# Patient Record
Sex: Male | Born: 1990 | Race: White | Hispanic: No | Marital: Single | Smoking: Current every day smoker
Health system: Southern US, Community
[De-identification: ages and names within clinical notes are randomized; demographics above are authoritative.]

## PROBLEM LIST (undated history)

## (undated) DIAGNOSIS — Z720 Tobacco use: Secondary | ICD-10-CM

---

## 2011-04-06 ENCOUNTER — Emergency Department: Payer: Self-pay | Admitting: Emergency Medicine

## 2012-01-30 IMAGING — CR DG HAND COMPLETE 3+V*L*
1 series · 3 of 3 positions shown · non-contrast
Comparison: none

REASON FOR EXAM: dog bite, pain, bleeding      ED Wait Area.
COMMENTS:

PROCEDURE:     DXR - DXR HAND LT COMPLETE  W/OBLIQUES  - April 06, 2011 [DATE]
RESULT:     There is no evidence of fracture, dislocation, or malalignment.

[Series 1: view not recorded · 0.17mm/px · 3 of 3 slices shown]
[im 1/3]
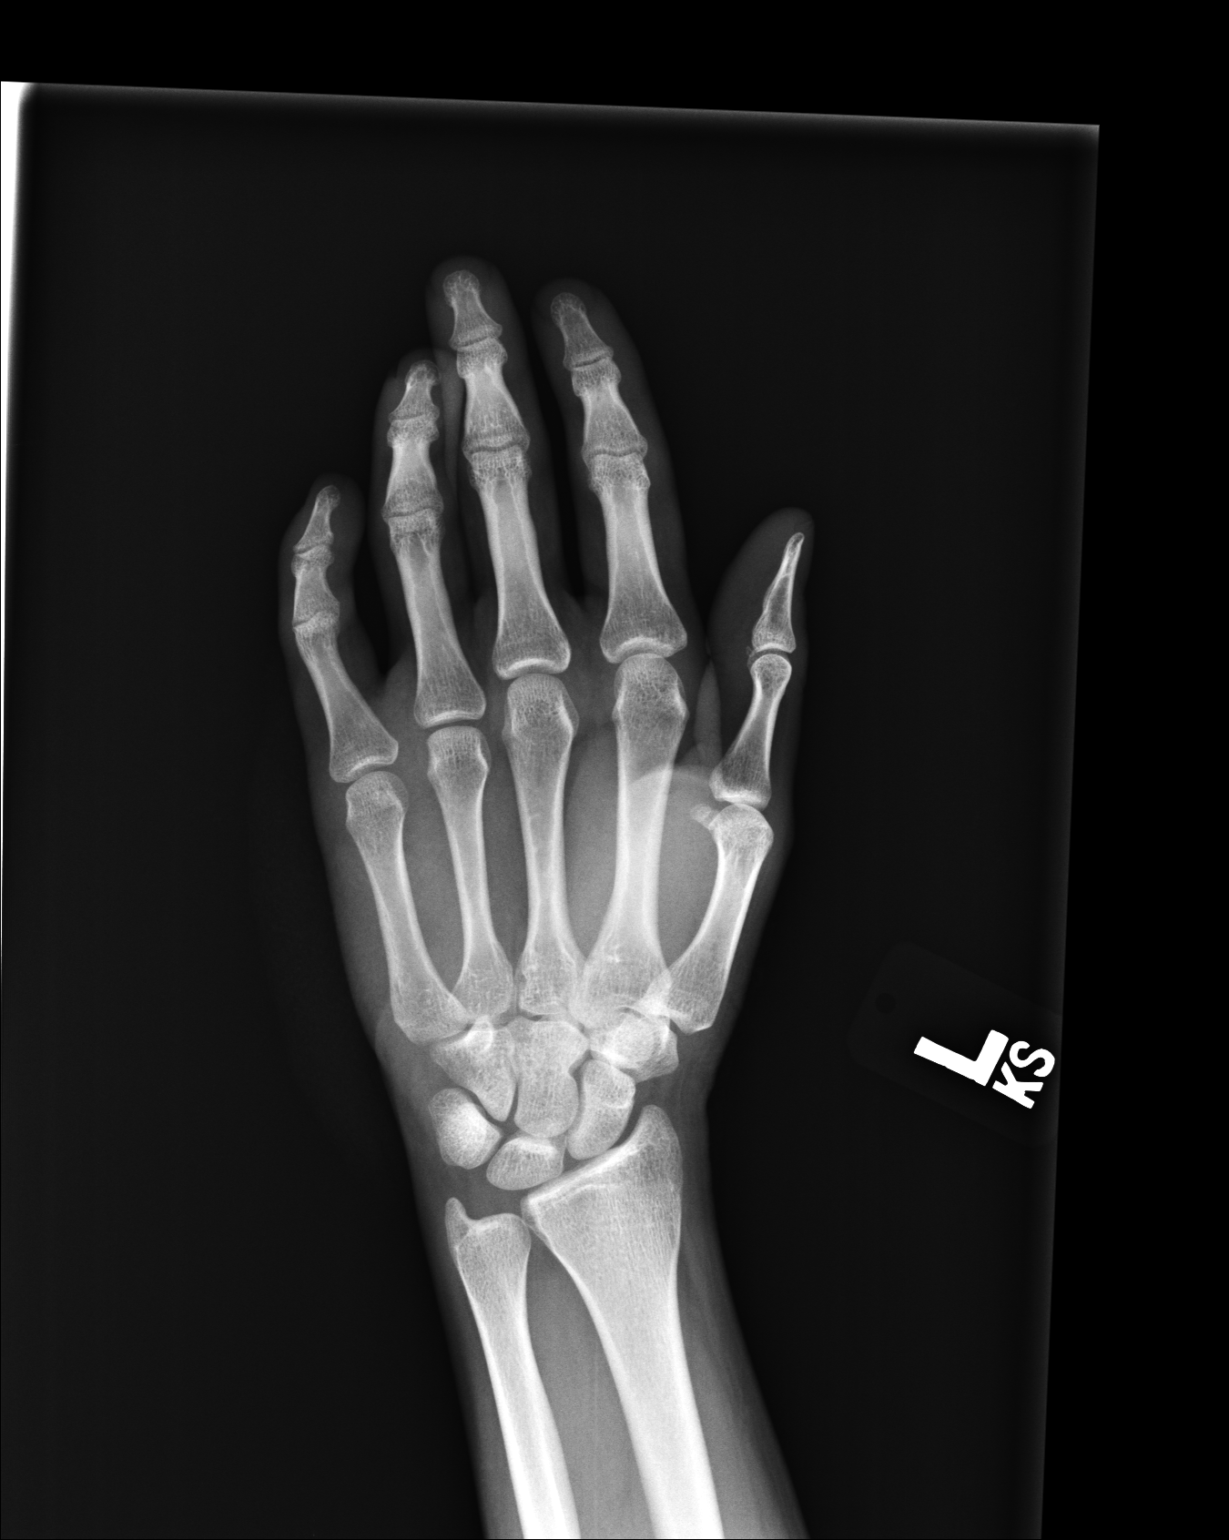
[im 2/3]
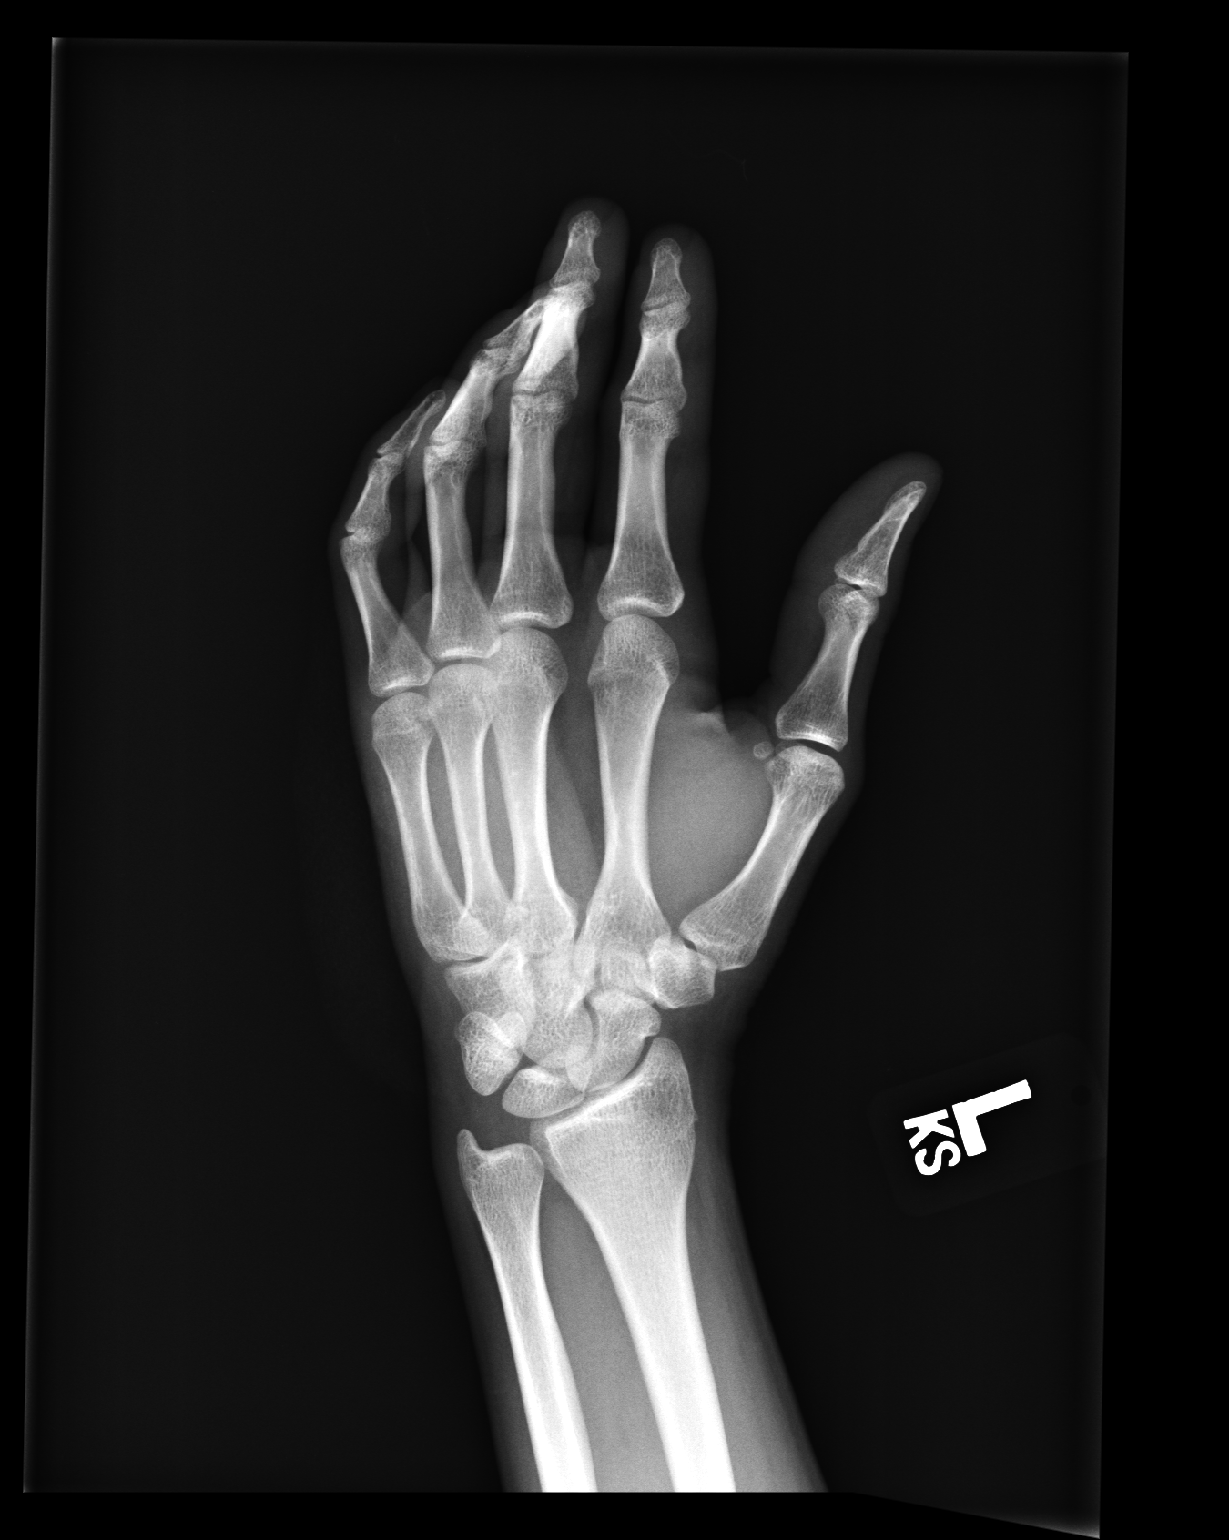
[im 3/3]
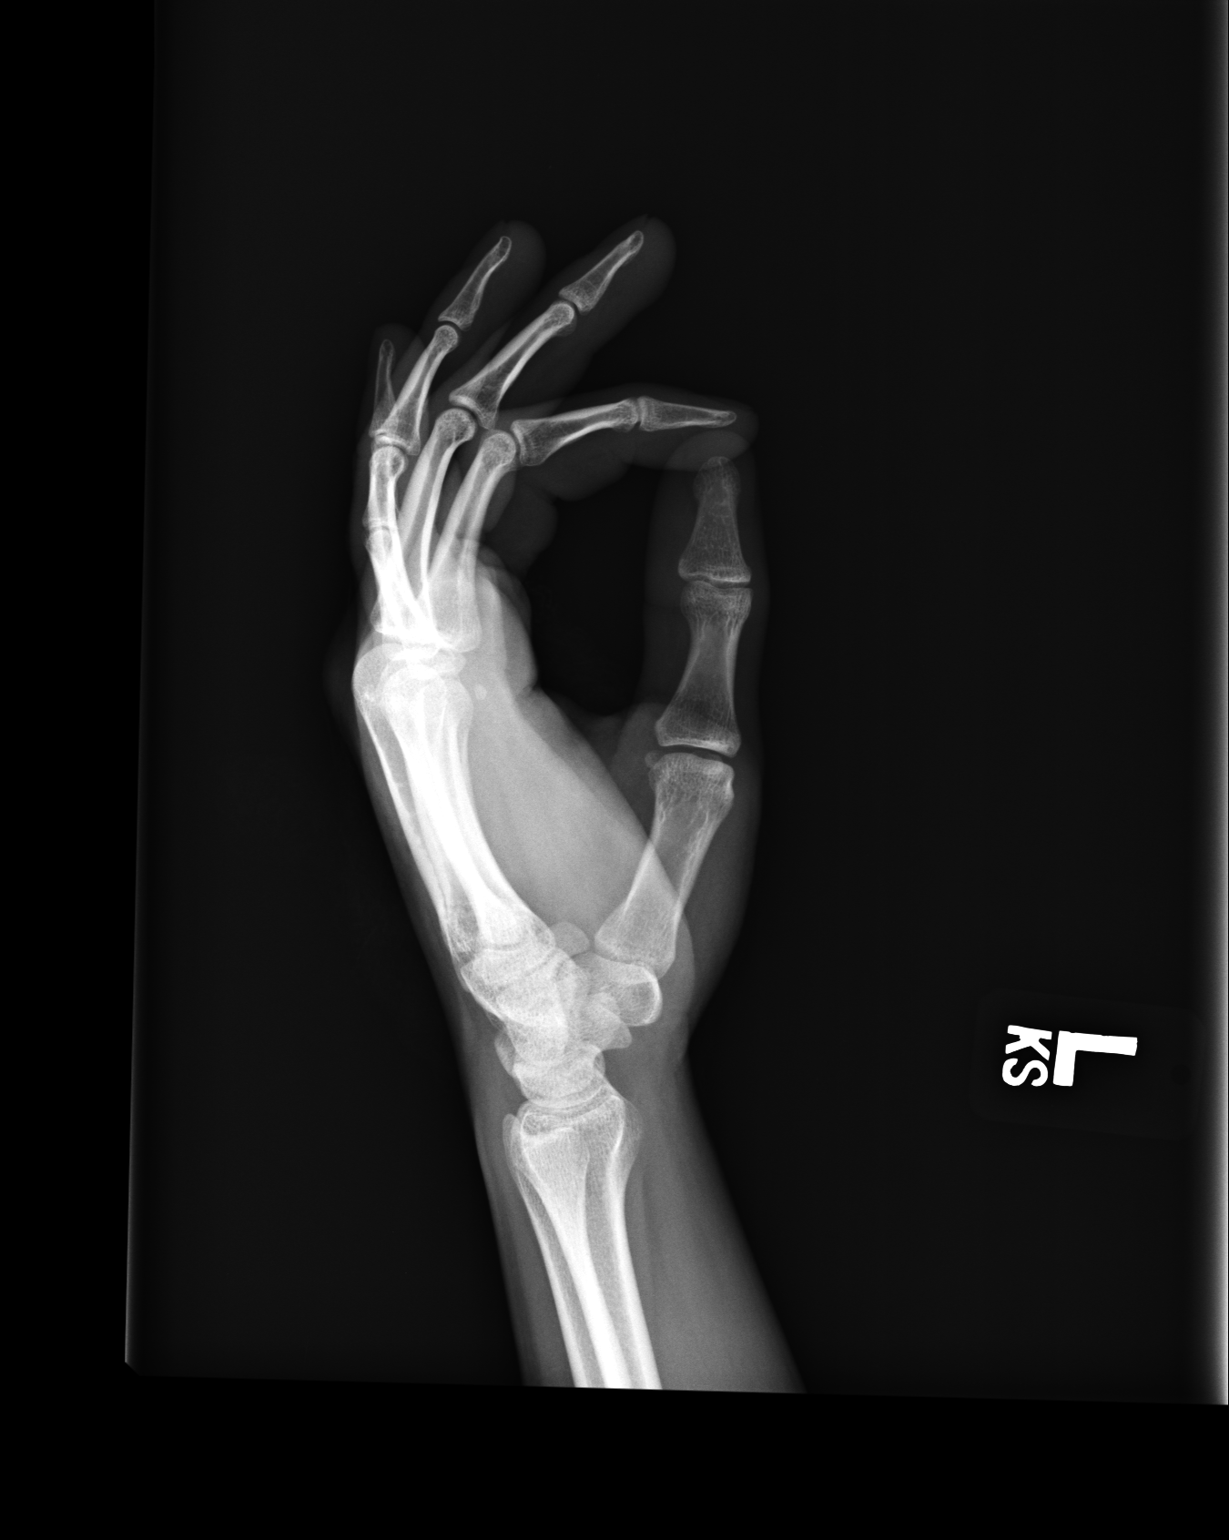

[3 of 3 positions shown; findings below may reference images not displayed]

IMPRESSION: 1. No evidence of acute abnormalities.
2. If there are persistent complaints of pain or persistent clinical
concern, a repeat evaluation in 7-10 days is recommended if clinically
warranted.

## 2019-05-20 ENCOUNTER — Telehealth: Payer: Self-pay

## 2019-05-20 DIAGNOSIS — Z20822 Contact with and (suspected) exposure to covid-19: Secondary | ICD-10-CM

## 2019-05-20 NOTE — Telephone Encounter (Signed)
Contacted by Holley Raring at [facility]. Pt needs COVID-19 testing.  Office [phone] Fax [phone]  Call with mother. Appointment scheduled and order placed.

## 2019-05-23 ENCOUNTER — Other Ambulatory Visit: Payer: Self-pay

## 2019-05-23 DIAGNOSIS — Z20822 Contact with and (suspected) exposure to covid-19: Secondary | ICD-10-CM

## 2019-05-26 LAB — NOVEL CORONAVIRUS, NAA: SARS-CoV-2, NAA: NOT DETECTED

## 2021-11-21 ENCOUNTER — Other Ambulatory Visit: Payer: Self-pay

## 2021-11-21 ENCOUNTER — Ambulatory Visit
Admission: EM | Admit: 2021-11-21 | Discharge: 2021-11-21 | Disposition: A | Discharge status: Home / Self Care | Payer: Self-pay | Attending: Emergency Medicine | Admitting: Emergency Medicine

## 2021-11-21 DIAGNOSIS — J069 Acute upper respiratory infection, unspecified: Secondary | ICD-10-CM | POA: Insufficient documentation

## 2021-11-21 LAB — GROUP A STREP BY PCR: Group A Strep by PCR: NOT DETECTED

## 2021-11-21 NOTE — ED Triage Notes (Signed)
Pt c/o body chills nasal congestion starting on 11/17/21. Pt has been taking OTC medication. Pt reports a fever of 102. Pt has taken a home covid test and was negative on 11/17/21 and 11/20/21.

## 2021-11-21 NOTE — Discharge Instructions (Addendum)
Your symptoms today are most likely being caused by a virus and should steadily improve in time it can take up to 7 to 10 days before you truly start to see a turnaround however things will get better    Your strep test is pending, we will call if positive and medication sent to pharmacy   You can take Tylenol and/or Ibuprofen as needed for fever reduction and pain relief.   For cough: honey 1/2 to 1 teaspoon (you can dilute the honey in water or another fluid).  You can also use guaifenesin and dextromethorphan for cough. You can use a humidifier for chest congestion and cough.  If you don't have a humidifier, you can sit in the bathroom with the hot shower running.      For sore throat: try warm salt water gargles, cepacol lozenges, throat spray, warm tea or water with lemon/honey, popsicles or ice, or OTC cold relief medicine for throat discomfort.   For congestion: take a daily anti-histamine like Zyrtec, Claritin, and a oral decongestant, such as pseudoephedrine.  You can also use Flonase 1-2 sprays in each nostril daily.   It is important to stay hydrated: drink plenty of fluids (water, gatorade/powerade/pedialyte, juices, or teas) to keep your throat moisturized and help further relieve irritation/discomfort.

## 2021-11-21 NOTE — ED Provider Notes (Signed)
[facility]    CSN: [id number] Arrival date & time: 11/21/21  1244      History   Chief Complaint Chief Complaint  Patient presents with   Cough   Nasal Congestion    HPI Leon Williams is a 30 y.o. male.   Patient presents with chills, fever, nasal congestion, rhinorrhea and nonproductive cough for 5 days.  Decreased appetite but able to tolerate fluids.  Known sick contacts. Has attempted use nyquil or dayquil which was not helpful. Home covid text negative times 2.  No pertinent medical history.  History reviewed. No pertinent past medical history.  There are no problems to display for this patient.   History reviewed. No pertinent surgical history.     Home Medications    Prior to Admission medications   Not on File    Family History History reviewed. No pertinent family history.  Social History Social History   Tobacco Use   Smoking status: Every Day    Types: Cigarettes   Smokeless tobacco: Never  Vaping Use   Vaping Use: Every day  Substance Use Topics   Alcohol use: Never   Drug use: Never     Allergies   Patient has no allergy information on record.   Review of Systems Review of Systems  Constitutional:  Positive for appetite change, chills and fever. Negative for activity change, diaphoresis, fatigue and unexpected weight change.  HENT:  Positive for congestion and rhinorrhea. Negative for dental problem, drooling, ear discharge, ear pain, facial swelling, hearing loss, mouth sores, nosebleeds, postnasal drip, sinus pressure, sinus pain, sneezing, sore throat, tinnitus, trouble swallowing and voice change.   Respiratory:  Positive for cough. Negative for apnea, choking, chest tightness, shortness of breath, wheezing and stridor.   Gastrointestinal: Negative.   Neurological: Negative.     Physical Exam Triage Vital Signs ED Triage Vitals  Enc Vitals Group     BP 11/21/21 1317 137/82     Pulse Rate 11/21/21 1317 (!) 112      Resp 11/21/21 1317 18     Temp 11/21/21 1317 99 F (37.2 C)     Temp Source 11/21/21 1317 Oral     SpO2 11/21/21 1317 100 %     Weight 11/21/21 1315 186 lb (84.4 kg)     Height 11/21/21 1315 5\' 10"  (1.778 m)     Head Circumference --      Peak Flow --      Pain Score 11/21/21 1315 0     Pain Loc --      Pain Edu? --      Excl. in GC? --    No data found.  Updated Vital Signs BP 137/82 (BP Location: Left Arm)    Pulse (!) 112    Temp 99 F (37.2 C) (Oral)    Resp 18    Ht 5\' 10"  (1.778 m)    Wt 186 lb (84.4 kg)    SpO2 100%    BMI 26.69 kg/m   Visual Acuity Right Eye Distance:   Left Eye Distance:   Bilateral Distance:    Right Eye Near:   Left Eye Near:    Bilateral Near:     Physical Exam Constitutional:      Appearance: Normal appearance. He is ill-appearing.  HENT:     Head: Normocephalic.     Right Ear: Tympanic membrane, ear canal and external ear normal.     Left Ear: Tympanic membrane, ear canal and external  ear normal.     Nose: Congestion and rhinorrhea present.     Mouth/Throat:     Mouth: Mucous membranes are moist.     Pharynx: Posterior oropharyngeal erythema present.     Tonsils: No tonsillar exudate. 1+ on the right. 1+ on the left.  Eyes:     Extraocular Movements: Extraocular movements intact.  Cardiovascular:     Rate and Rhythm: Normal rate and regular rhythm.     Pulses: Normal pulses.     Heart sounds: Normal heart sounds.  Pulmonary:     Effort: Pulmonary effort is normal.     Breath sounds: Normal breath sounds.  Musculoskeletal:     Cervical back: Normal range of motion.  Skin:    General: Skin is warm and dry.  Neurological:     Mental Status: He is alert and oriented to person, place, and time. Mental status is at baseline.  Psychiatric:        Mood and Affect: Mood normal.        Behavior: Behavior normal.     UC Treatments / Results  Labs (all labs ordered are listed, but only abnormal results are displayed) Labs Reviewed  - No data to display  EKG   Radiology No results found.  Procedures Procedures (including critical care time)  Medications Ordered in UC Medications - No data to display  Initial Impression / Assessment and Plan / UC Course  I have reviewed the triage vital signs and the nursing notes.  Pertinent labs & imaging results that were available during my care of the patient were reviewed by me and considered in my medical decision making (see chart for details).  Viral URI with cough  Will defer flu testing at this time antibiogram, strep test pending, over-the-counter medications for symptomatic management, encouraged increase fluid intake until appetite returns, urgent care follow-up as needed, work note given Final Clinical Impressions(s) / UC Diagnoses   Final diagnoses:  None   Discharge Instructions   None    ED Prescriptions   None    PDMP not reviewed this encounter.   Valinda Hoar, NP 11/21/21 1419

## 2022-01-21 ENCOUNTER — Other Ambulatory Visit: Payer: Self-pay

## 2022-01-21 ENCOUNTER — Ambulatory Visit
Admission: EM | Admit: 2022-01-21 | Discharge: 2022-01-21 | Disposition: A | Discharge status: Home / Self Care | Payer: Self-pay | Attending: Emergency Medicine | Admitting: Emergency Medicine

## 2022-01-21 DIAGNOSIS — F17200 Nicotine dependence, unspecified, uncomplicated: Secondary | ICD-10-CM

## 2022-01-21 DIAGNOSIS — L03313 Cellulitis of chest wall: Secondary | ICD-10-CM

## 2022-01-21 MED ORDER — DOXYCYCLINE HYCLATE 100 MG PO CAPS: 100.0000 mg | ORAL_CAPSULE | Freq: Two times a day (BID) | ORAL | 0 refills | Status: DC

## 2022-01-21 MED ORDER — MUPIROCIN CALCIUM 2 % EX CREA: 1.0000 "application " | TOPICAL_CREAM | Freq: Two times a day (BID) | CUTANEOUS | 0 refills | Status: DC

## 2022-01-21 NOTE — ED Triage Notes (Addendum)
Patient presents to Urgent Care with complaints of knot on chest area since Saturday. Pt states he is unsure if the knot is result from a chemical that splashed on his chest area. Denies pain but states some burning sensation.   Denies fever.

## 2022-01-21 NOTE — Discharge Instructions (Addendum)
Stop smoking. Rest, push fluids, take meds as directed. Follow up with PCP. Return if worsening or no improvement. Warm compresses.

## 2022-01-21 NOTE — ED Provider Notes (Signed)
[facility]    CSN: [id number] Arrival date & time: 01/21/22  1511      History   Chief Complaint Chief Complaint  Patient presents with   Cough   Sore Throat    HPI Leon Williams is a 31 y.o. male.   31 year old male pt, Leon Williams, presents to urgent care, chief complaint of knot on left side of anterior chest since Saturday, not sure if ingrown hair or chemical from work. NKDA.Smokes ~1ppd of cigarettes.   The history is provided by the patient. No language interpreter was used.   History reviewed. No pertinent past medical history.  Patient Active Problem List   Diagnosis Date Noted   Smoker 01/21/2022    History reviewed. No pertinent surgical history.     Home Medications    Prior to Admission medications   Medication Sig Start Date End Date Taking? Authorizing Provider  doxycycline (VIBRAMYCIN) 100 MG capsule Take 1 capsule (100 mg total) by mouth 2 (two) times daily. 01/21/22  Yes Duvid Smalls, Para March, NP  mupirocin cream (BACTROBAN) 2 % Apply 1 application topically 2 (two) times daily. 01/21/22  Yes Benjerman Molinelli, Para March, NP    Family History History reviewed. No pertinent family history.  Social History Social History   Tobacco Use   Smoking status: Every Day    Types: Cigarettes   Smokeless tobacco: Never  Vaping Use   Vaping Use: Every day  Substance Use Topics   Alcohol use: Never   Drug use: Never     Allergies   Patient has no known allergies.   Review of Systems Review of Systems  Constitutional:  Negative for fever.  Skin:  Positive for color change and wound.  All other systems reviewed and are negative.   Physical Exam Triage Vital Signs ED Triage Vitals  Enc Vitals Group     BP 01/21/22 1602 (!) 145/71     Pulse Rate 01/21/22 1602 (!) 102     Resp 01/21/22 1602 16     Temp 01/21/22 1602 98.2 F (36.8 C)     Temp Source 01/21/22 1602 Oral     SpO2 01/21/22 1602 100 %     Weight --      Height --       Head Circumference --      Peak Flow --      Pain Score 01/21/22 1559 0     Pain Loc --      Pain Edu? --      Excl. in GC? --    No data found.  Updated Vital Signs BP (!) 145/71 (BP Location: Left Arm)    Pulse (!) 102    Temp 98.2 F (36.8 C) (Oral)    Resp 16    SpO2 100%   Visual Acuity Right Eye Distance:   Left Eye Distance:   Bilateral Distance:    Right Eye Near:   Left Eye Near:    Bilateral Near:     Physical Exam Vitals and nursing note reviewed.  Constitutional:      Appearance: Normal appearance. He is well-developed and well-groomed.  HENT:     Head: Normocephalic.  Cardiovascular:     Rate and Rhythm: Normal rate.     Pulses: Normal pulses.     Heart sounds: Normal heart sounds.  Pulmonary:     Effort: Pulmonary effort is normal.     Breath sounds: Normal breath sounds.  Skin:    General: Skin  is warm.     Capillary Refill: Capillary refill takes less than 2 seconds.     Findings: Lesion and wound present.       Neurological:     General: No focal deficit present.     Mental Status: He is alert and oriented to person, place, and time.     Cranial Nerves: Cranial nerves 2-12 are intact.     Sensory: Sensation is intact.     Motor: Motor function is intact.     Coordination: Coordination is intact.     Gait: Gait is intact.  Psychiatric:        Attention and Perception: Attention normal.        Mood and Affect: Mood normal.        Speech: Speech normal.        Behavior: Behavior normal. Behavior is cooperative.     UC Treatments / Results  Labs (all labs ordered are listed, but only abnormal results are displayed) Labs Reviewed - No data to display  EKG   Radiology No results found.  Procedures Procedures (including critical care time)  Medications Ordered in UC Medications - No data to display  Initial Impression / Assessment and Plan / UC Course  I have reviewed the triage vital signs and the nursing notes.  Pertinent labs &  imaging results that were available during my care of the patient were reviewed by me and considered in my medical decision making (see chart for details).     Ddx: Abscess, cellulitis Final Clinical Impressions(s) / UC Diagnoses   Final diagnoses:  Smoker  Cellulitis of chest wall     Discharge Instructions      Stop smoking. Rest, push fluids, take meds as directed. Follow up with PCP. Return if worsening or no improvement. Warm compresses.      ED Prescriptions     Medication Sig Dispense Auth. Provider   doxycycline (VIBRAMYCIN) 100 MG capsule Take 1 capsule (100 mg total) by mouth 2 (two) times daily. 20 capsule Rinoa Garramone, NP   mupirocin cream (BACTROBAN) 2 % Apply 1 application topically 2 (two) times daily. 15 g Robin Pafford, Para March, NP      PDMP not reviewed this encounter.   Clancy Gourd, NP 01/21/22 1705

## 2023-11-06 ENCOUNTER — Other Ambulatory Visit: Payer: Self-pay

## 2023-11-06 ENCOUNTER — Inpatient Hospital Stay
Admission: EM | Admit: 2023-11-06 | Discharge: 2023-11-09 | DRG: 872 | Disposition: A | Discharge status: Home / Self Care | Payer: Self-pay | Attending: Internal Medicine | Admitting: Internal Medicine

## 2023-11-06 ENCOUNTER — Emergency Department: Payer: Self-pay

## 2023-11-06 DIAGNOSIS — J029 Acute pharyngitis, unspecified: Secondary | ICD-10-CM | POA: Diagnosis present

## 2023-11-06 DIAGNOSIS — Z8249 Family history of ischemic heart disease and other diseases of the circulatory system: Secondary | ICD-10-CM | POA: Diagnosis not present

## 2023-11-06 DIAGNOSIS — K1120 Sialoadenitis, unspecified: Secondary | ICD-10-CM | POA: Diagnosis present

## 2023-11-06 DIAGNOSIS — A419 Sepsis, unspecified organism: Principal | ICD-10-CM

## 2023-11-06 DIAGNOSIS — F17210 Nicotine dependence, cigarettes, uncomplicated: Secondary | ICD-10-CM | POA: Diagnosis present

## 2023-11-06 DIAGNOSIS — R000 Tachycardia, unspecified: Secondary | ICD-10-CM | POA: Insufficient documentation

## 2023-11-06 DIAGNOSIS — K0889 Other specified disorders of teeth and supporting structures: Secondary | ICD-10-CM | POA: Diagnosis present

## 2023-11-06 DIAGNOSIS — F17200 Nicotine dependence, unspecified, uncomplicated: Secondary | ICD-10-CM | POA: Diagnosis present

## 2023-11-06 DIAGNOSIS — D72829 Elevated white blood cell count, unspecified: Secondary | ICD-10-CM | POA: Insufficient documentation

## 2023-11-06 HISTORY — DX: Tobacco use: Z72.0

## 2023-11-06 LAB — BASIC METABOLIC PANEL
Anion gap: 8 (ref 5–15)
BUN: 10 mg/dL (ref 6–20)
CO2: 25 mmol/L (ref 22–32)
Calcium: 8.3 mg/dL — ABNORMAL LOW (ref 8.9–10.3)
Chloride: 99 mmol/L (ref 98–111)
Creatinine, Ser: 0.78 mg/dL (ref 0.61–1.24)
GFR, Estimated: >60 mL/min (ref >60)
Glucose, Bld: 109 mg/dL — ABNORMAL HIGH (ref 70–99)
Potassium: 3.8 mmol/L (ref 3.5–5.1)
Sodium: 132 mmol/L — ABNORMAL LOW (ref 135–145)

## 2023-11-06 LAB — GROUP A STREP BY PCR: Group A Strep by PCR: NOT DETECTED

## 2023-11-06 LAB — CBC
HCT: 49.4 % (ref 39.0–52.0)
Hemoglobin: 16.6 g/dL (ref 13.0–17.0)
MCH: 30.6 pg (ref 26.0–34.0)
MCHC: 33.6 g/dL (ref 30.0–36.0)
MCV: 91.1 fL (ref 80.0–100.0)
Platelets: 319 10*3/uL (ref 150–400)
RBC: 5.42 MIL/uL (ref 4.22–5.81)
RDW: 13.9 % (ref 11.5–15.5)
WBC: 19.6 10*3/uL — ABNORMAL HIGH (ref 4.0–10.5)
nRBC: 0 % (ref 0.0–0.2)

## 2023-11-06 LAB — LACTIC ACID, PLASMA
Lactic Acid, Venous: 0.8 mmol/L (ref 0.5–1.9)
Lactic Acid, Venous: 0.7 mmol/L (ref 0.5–1.9)

## 2023-11-06 LAB — MRSA NEXT GEN BY PCR, NASAL: MRSA by PCR Next Gen: NOT DETECTED

## 2023-11-06 MED ORDER — ONDANSETRON HCL 4 MG/2ML IJ SOLN: 4.0000 mg | Freq: Four times a day (QID) | INTRAMUSCULAR | Status: DC | PRN

## 2023-11-06 MED ORDER — ENOXAPARIN SODIUM 40 MG/0.4ML IJ SOSY: 40.0000 mg | PREFILLED_SYRINGE | INTRAMUSCULAR | Status: DC

## 2023-11-06 MED ORDER — PIPERACILLIN-TAZOBACTAM 3.375 G IVPB
3.3750 g | Freq: Three times a day (TID) | INTRAVENOUS | Status: DC
Start: 2023-11-06 — End: 2023-11-07
  Administered 2023-11-06 – 2023-11-07 (×2): 3.375 g via INTRAVENOUS
  Filled 2023-11-06 (×2): qty 50

## 2023-11-06 MED ORDER — ACETAMINOPHEN 650 MG RE SUPP: 650.0000 mg | Freq: Four times a day (QID) | RECTAL | Status: DC | PRN

## 2023-11-06 MED ORDER — MORPHINE SULFATE (PF) 4 MG/ML IV SOLN: 4.0000 mg | INTRAVENOUS | Status: DC | PRN

## 2023-11-06 MED ORDER — SENNOSIDES-DOCUSATE SODIUM 8.6-50 MG PO TABS: 1.0000 | ORAL_TABLET | Freq: Every evening | ORAL | Status: DC | PRN

## 2023-11-06 MED ORDER — SODIUM CHLORIDE 0.9 % IV BOLUS
1000.0000 mL | Freq: Once | INTRAVENOUS | Status: AC
  Administered 2023-11-06: 1000 mL via INTRAVENOUS

## 2023-11-06 MED ORDER — PIPERACILLIN-TAZOBACTAM 3.375 G IVPB
3.3750 g | Freq: Once | INTRAVENOUS | Status: AC
  Administered 2023-11-06: 3.375 g via INTRAVENOUS
  Filled 2023-11-06: qty 50

## 2023-11-06 MED ORDER — ONDANSETRON HCL 4 MG PO TABS: 4.0000 mg | ORAL_TABLET | Freq: Four times a day (QID) | ORAL | Status: DC | PRN

## 2023-11-06 MED ORDER — HYDROMORPHONE HCL 1 MG/ML IJ SOLN
0.5000 mg | INTRAMUSCULAR | Status: DC | PRN
  Administered 2023-11-06 – 2023-11-07 (×3): 0.5 mg via INTRAVENOUS
  Filled 2023-11-06 (×3): qty 0.5

## 2023-11-06 MED ORDER — VANCOMYCIN HCL 500 MG/100ML IV SOLN
500.0000 mg | Freq: Once | INTRAVENOUS | Status: AC
  Administered 2023-11-06: 500 mg via INTRAVENOUS
  Filled 2023-11-06 (×3): qty 100

## 2023-11-06 MED ORDER — ONDANSETRON HCL 4 MG/2ML IJ SOLN
4.0000 mg | Freq: Once | INTRAMUSCULAR | Status: AC
  Administered 2023-11-06: 4 mg via INTRAVENOUS
  Filled 2023-11-06: qty 2

## 2023-11-06 MED ORDER — IOHEXOL 300 MG/ML  SOLN
75.0000 mL | Freq: Once | INTRAMUSCULAR | Status: AC | PRN
  Administered 2023-11-06: 75 mL via INTRAVENOUS

## 2023-11-06 MED ORDER — MENTHOL 3 MG MT LOZG: 1.0000 | LOZENGE | OROMUCOSAL | Status: DC | PRN

## 2023-11-06 MED ORDER — NICOTINE 21 MG/24HR TD PT24
21.0000 mg | MEDICATED_PATCH | Freq: Every day | TRANSDERMAL | Status: DC | PRN
  Administered 2023-11-06 – 2023-11-07 (×2): 21 mg via TRANSDERMAL
  Filled 2023-11-06 (×2): qty 1

## 2023-11-06 MED ORDER — ENOXAPARIN SODIUM 40 MG/0.4ML IJ SOSY
40.0000 mg | PREFILLED_SYRINGE | INTRAMUSCULAR | Status: DC
  Administered 2023-11-07 – 2023-11-08 (×2): 40 mg via SUBCUTANEOUS
  Filled 2023-11-06 (×2): qty 0.4

## 2023-11-06 MED ORDER — METOPROLOL TARTRATE 5 MG/5ML IV SOLN: 5.0000 mg | INTRAVENOUS | Status: DC | PRN

## 2023-11-06 MED ORDER — ACETAMINOPHEN 325 MG PO TABS: 650.0000 mg | ORAL_TABLET | Freq: Four times a day (QID) | ORAL | Status: DC | PRN

## 2023-11-06 MED ORDER — VANCOMYCIN HCL IN DEXTROSE 1-5 GM/200ML-% IV SOLN
1000.0000 mg | Freq: Once | INTRAVENOUS | Status: AC
  Administered 2023-11-06: 1000 mg via INTRAVENOUS
  Filled 2023-11-06: qty 200

## 2023-11-06 MED ORDER — HYDROMORPHONE HCL 1 MG/ML IJ SOLN: 1.0000 mg | INTRAMUSCULAR | Status: DC | PRN

## 2023-11-06 MED ORDER — SODIUM CHLORIDE 0.9 % IV SOLN
INTRAVENOUS | Status: AC
Start: 2023-11-06 — End: 2023-11-07

## 2023-11-06 MED ORDER — VANCOMYCIN HCL 1250 MG/250ML IV SOLN
1250.0000 mg | Freq: Two times a day (BID) | INTRAVENOUS | Status: DC
  Administered 2023-11-07: 1250 mg via INTRAVENOUS
  Filled 2023-11-06 (×2): qty 250

## 2023-11-06 MED ORDER — HYDROMORPHONE HCL 1 MG/ML IJ SOLN
0.5000 mg | Freq: Once | INTRAMUSCULAR | Status: AC
  Administered 2023-11-06: 0.5 mg via INTRAVENOUS
  Filled 2023-11-06: qty 0.5

## 2023-11-06 NOTE — ED Provider Notes (Signed)
[facility] Provider Note    Event Date/Time   First MD Initiated Contact with Patient 11/06/23 1236     (approximate)   History   Dental Pain   HPI  Leon Williams is a 32 y.o. male otherwise healthy who comes in with right jaw pain.  Patient reports that he had some jaw swelling noted yesterday but significantly increased in size since then.  He reports significant pain associated with it.  He denies feeling anything inside of his mouth.  Denies any known fevers.  Denies ever having anything like this happen before.   Physical Exam   Triage Vital Signs: ED Triage Vitals  Encounter Vitals Group     BP 11/06/23 1214 (!) 137/90     Systolic BP Percentile --      Diastolic BP Percentile --      Pulse Rate 11/06/23 1214 (!) 126     Resp 11/06/23 1214 19     Temp 11/06/23 1214 98.7 F (37.1 C)     Temp src --      SpO2 11/06/23 1214 100 %     Weight --      Height --      Head Circumference --      Peak Flow --      Pain Score 11/06/23 1212 8     Pain Loc --      Pain Education --      Exclude from Growth Chart --     Most recent vital signs: Vitals:   11/06/23 1214  BP: (!) 137/90  Pulse: (!) 126  Resp: 19  Temp: 98.7 F (37.1 C)  SpO2: 100%     General: Awake, no distress.  CV:  Good peripheral perfusion.  Resp:  Normal effort.  Abd:  No distention.  Other:  Significant right-sided facial swelling that goes up underneath the right ear.  TM has got wax in it.  Oropharynx is clear without any obvious abscess felt on the inside.   ED Results / Procedures / Treatments   Labs (all labs ordered are listed, but only abnormal results are displayed) Labs Reviewed  CBC - Abnormal; Notable for the following components:      Result Value   WBC 19.6 (*)    All other components within normal limits  BASIC METABOLIC PANEL - Abnormal; Notable for the following components:   Sodium 132 (*)    Glucose, Bld 109 (*)    Calcium 8.3 (*)     All other components within normal limits  CULTURE, BLOOD (ROUTINE X 2)  CULTURE, BLOOD (ROUTINE X 2)  LACTIC ACID, PLASMA  LACTIC ACID, PLASMA    RADIOLOGY I have reviewed the CT personally and interpreted +swelling  PROCEDURES:  Critical Care performed: Yes, see critical care procedure note(s)  .Critical Care  Performed by: Concha Se, MD Authorized by: Concha Se, MD   Critical care provider statement:    Critical care time (minutes):  30   Critical care was necessary to treat or prevent imminent or life-threatening deterioration of the following conditions:  Sepsis   Critical care was time spent personally by me on the following activities:  Development of treatment plan with patient or surrogate, discussions with consultants, evaluation of patient's response to treatment, examination of patient, ordering and review of laboratory studies, ordering and review of radiographic studies, ordering and performing treatments and interventions, pulse oximetry, re-evaluation of patient's condition and review of old charts  MEDICATIONS ORDERED IN ED: Medications  vancomycin (VANCOCIN) IVPB 1000 mg/200 mL premix (has no administration in time range)  piperacillin-tazobactam (ZOSYN) IVPB 3.375 g (has no administration in time range)  HYDROmorphone (DILAUDID) injection 0.5 mg (has no administration in time range)  ondansetron (ZOFRAN) injection 4 mg (has no administration in time range)  sodium chloride 0.9 % bolus 1,000 mL (has no administration in time range)     IMPRESSION / MDM / ASSESSMENT AND PLAN / ED COURSE  I reviewed the triage vital signs and the nursing notes.   Patient's presentation is most consistent with acute presentation with potential threat to life or bodily function.   Patient comes in meeting sepsis criteria with elevated white count, heart rate.  Sepsis alert called.  Patient be started on antibiotics, broad-spectrum as well as fluids.  Will get CT  imaging including CT temporal to ensure does not go back behind the ear to suggest mastoiditis.  Will give patient a dose of IV Dilaudid to help with symptoms.   CBC elevated white count.  Lactate normal.  BMP reassuring.  Patient handed off pending CT imaging but suspect patient will require admission for sepsis, facial abscess    FINAL CLINICAL IMPRESSION(S) / ED DIAGNOSES   Final diagnoses:  Sepsis, due to unspecified organism, unspecified whether acute organ dysfunction present [facility])     Rx / DC Orders   ED Discharge Orders     None        Note:  This document was prepared using Dragon voice recognition software and may include unintentional dictation errors.   Concha Se, MD 11/06/23 [phone]

## 2023-11-06 NOTE — ED Triage Notes (Signed)
Pt comes with c/o right sided facial swelling. Pt states bad tooth on left side. Pt states he noticed it yesterday. Pt does have obvious swelling noted to right side.

## 2023-11-06 NOTE — H&P (Addendum)
History and Physical   Leon Williams ZOX:[id number] DOB: 27-Jan-1991 DOA: 11/06/2023  PCP: Pcp, No  Patient coming from: Home  I have personally briefly reviewed patient's old medical records in [facility] EMR.  Chief Concern: Right-sided facial swelling  HPI: Mr. Leon Williams is a 32 year old male with history of tobacco use, who presents emergency department for chief concerns of right facial swelling.  Vitals in the ED showed temperature of 98.7, respiration rate 19, heart rate 126, blood pressure 137/90, SpO2 of 100% on room air.  Serum sodium is 132, potassium 3.8, chloride 99, bicarb 25, BUN of 10, serum creatinine 0.78, EGFR greater than 60, nonfasting blood glucose 109, WBC 19.6, hemoglobin 16.6, platelets of 319.  Lactic acid is 0.8, on repeat was 0.7.  ED treatment: Dilaudid 0.5 mg IV one-time dose, ondansetron 4 mg IV one-time dose, Zosyn, vancomycin, sodium chloride 1 L bolus. ------------------------------- At bedside, patient is awake alert and oriented x 3.  He reports right-sided facial swelling he noticed that over the last 2 days.  He denies any known trauma or abnormalities including injury to his face or mouth.  He endorses a left-sided broken wisdom tooth.  He denies trauma to his person, nausea, vomiting, shortness of breath, current chest pain, abdominal pain, dysuria, hematuria, diarrhea, blood in his pee or stool.  He denies syncope or loss of consciousness.  He endorses poor p.o. intake over the last 2 days due to the pain with swallowing.  He denies hearing loss, ringing in ears, hearing changes.  Social history: He lives at home.  He currently smokes about 1 pack/day.  He denies EtOH and recreational drug use.  He currently is in between jobs.  ROS: Constitutional: no weight change, no fever ENT/Mouth: no sore throat, no rhinorrhea Eyes: no eye pain, no vision changes Cardiovascular: no chest pain, no dyspnea,  no edema, no palpitations Respiratory: no  cough, no sputum, no wheezing Gastrointestinal: no nausea, no vomiting, no diarrhea, no constipation Genitourinary: no urinary incontinence, no dysuria, no hematuria Musculoskeletal: no arthralgias, no myalgias Skin: no skin lesions, no pruritus, Neuro: + weakness, no loss of consciousness, no syncope Psych: no anxiety, no depression, + decrease appetite Heme/Lymph: no bruising, no bleeding  ED Course: Discussed with EDP, patient requiring hospitalization for chief concerns of parotiditis  Assessment/Plan  Principal Problem:   Parotiditis Active Problems:   Smoker   Leukocytosis   Sore throat   Sinus tachycardia   Assessment and Plan:  * Parotiditis     Check MRSA PCR, strep a Continue with vancomycin and Zosyn Lactic acid is reassuringly negative Admit to telemetry medical, inpatient  Sinus tachycardia Secondary to acute infection, treat per above Metoprolol tartrate 5 mg IV every 4 hours as needed for heart rate greater than 115, 3 doses ordered  Sore throat With bilaterally inflamed tonsils, tonsillitis cannot be excluded at this time Check strep a PCR Continue with sodium chloride infusion at 125 mL/h, 1 day ordered Cepacol lozenges as needed for sore throat ordered  Leukocytosis Suspect secondary to parotitis, treat per above Blood cultures x 2 have been ordered Check CBC in the a.m.  Smoker Extensive counseling for tobacco cessation  Chart reviewed.   DVT prophylaxis: Enoxaparin 40 mg subcutaneous Code Status: Full code Diet: N.p.o. with orders for nursing to advance diet as tolerated to full liquid Family Communication: Updated father at bedside Disposition Plan: Pending clinical course Consults called: Pharmacy Admission status: Telemetry medical, inpatient  Past Medical History:  Diagnosis  Date   Tobacco use    History reviewed. No pertinent surgical history.  Social History:  reports that he has been smoking cigarettes. He has never used  smokeless tobacco. He reports that he does not currently use drugs after having used the following drugs: Marijuana. He reports that he does not drink alcohol.  No Known Allergies Family History  Problem Relation Age of Onset   Hypertension Father    Family history: Family history reviewed and not pertinent  Prior to Admission medications   Medication Sig Start Date End Date Taking? Authorizing Provider  doxycycline (VIBRAMYCIN) 100 MG capsule Take 1 capsule (100 mg total) by mouth 2 (two) times daily. 01/21/22   Defelice, Para March, NP  mupirocin cream (BACTROBAN) 2 % Apply 1 application topically 2 (two) times daily. 01/21/22   Defelice, Para March, NP   Physical Exam: Vitals:   11/06/23 1214 11/06/23 1451 11/06/23 1624  BP: (!) 137/90    Pulse: (!) 126    Resp: 19    Temp: 98.7 F (37.1 C)  98.4 F (36.9 C)  TempSrc:   Axillary  SpO2: 100%    Weight:  72.6 kg   Height:  5\' 7"  (1.702 m)    Constitutional: appears age-appropriate, NAD, calm Eyes: PERRL, lids and conjunctivae normal ENMT: Mucous membranes are moist.  Bilateral tonsils appear red and swollen. Age-appropriate dentition. Hearing appropriate.  Bilateral tympanic membranes were not realized due to extensive cerumen deposits causing obstruction.  Right external ear has mild redness and negative for tenderness with manipulation during otoscope exam. Neck: normal, supple, no masses, no thyromegaly Respiratory: clear to auscultation bilaterally, no wheezing, no crackles. Normal respiratory effort. No accessory muscle use.  Cardiovascular: Regular rate and rhythm, no murmurs / rubs / gallops. No extremity edema. 2+ pedal pulses. No carotid bruits.  Abdomen: no tenderness, no masses palpated, no hepatosplenomegaly. Bowel sounds positive.  Musculoskeletal: no clubbing / cyanosis. No joint deformity upper and lower extremities. Good ROM, no contractures, no atrophy. Normal muscle tone.  Skin: Right sided facial swelling with  erythema at the right jawline.     Neurologic: Sensation intact. Strength 5/5 in all 4.  Psychiatric: Normal judgment and insight. Alert and oriented x 3. Normal mood.   EKG: Not indicated at this time  Chest x-ray on Admission: Not indicated at this time  CT Maxillofacial W Contrast Result Date: 11/06/2023 CLINICAL DATA:  Nasal abscess R facial swelling; Mastoiditis EXAM: CT MAXILLOFACIAL WITH CONTRAST; CT TEMPORAL BONES WITH CONTRAST TECHNIQUE: Axial and coronal plane CT imaging of the petrous temporal bones was performed with thin-collimation image reconstruction following intravenous contrast administration. Multiplanar CT image reconstructions were also generated. Multidetector CT imaging of the maxillofacial structures was performed with intravenous contrast. Multiplanar CT image reconstructions were also generated. RADIATION DOSE REDUCTION: This exam was performed according to the departmental dose-optimization program which includes automated exposure control, adjustment of the mA and/or kV according to patient size and/or use of iterative reconstruction technique. CONTRAST:  75mL OMNIPAQUE IOHEXOL 300 MG/ML  SOLN COMPARISON:  None Available. FINDINGS: Maxillofacial CT: Osseous: No fracture or mandibular dislocation. No destructive process. Orbits: Negative. No traumatic or inflammatory finding. Sinuses: Clear. Soft tissues: Edematous and asymmetrically enlarged right parotid gland with surrounding edema. No visible discrete, drainable fluid collection. Edema extends inferiorly into the right neck with mildly prominent right cervical chain lymph nodes that are likely reactive. No visible parotid duct calculus or ductal dilation. Limited intracranial: No significant or unexpected finding. RIGHT TEMPORAL BONE External  auditory canal: Soft tissue opacifies the deep aspect of the external auditory canal without adjacent bony change. Middle ear cavity: Normally aerated. The scutum and ossicles are  normal. The tegmen tympani is intact. Inner ear structures: The cochlea, vestibule and semicircular canals are normal. The vestibular aqueduct is not enlarged. Internal auditory and facial nerve canals:  Normal Mastoid air cells: Normally aerated. No osseous erosion. LEFT TEMPORAL BONE External auditory canal: Soft tissue opacifies the deep aspect of the external auditory canal without adjacent bony change. Middle ear cavity: Normally aerated. The scutum and ossicles are normal. The tegmen tympani is intact. Inner ear structures: The cochlea, vestibule and semicircular canals are normal. The vestibular aqueduct is not enlarged. Internal auditory and facial nerve canals:  Normal. Mastoid air cells: Normally aerated. No osseous erosion. Vascular: Normal non-contrast appearance of the carotid canals, jugular bulbs and sigmoid plates. Limited intracranial:  No acute or significant finding. Soft tissues: See maxillofacial CT above for further characterization IMPRESSION: 1. Edematous and asymmetrically enlarged right parotid gland with surrounding edema, suggestive of parotiditis and cellulitis. No abscess. No visible parotid duct calculus or ductal dilation. 2. Both external auditory canals are obstructed by soft tissue attenuation. This could be cerumen; however, recommend correlation with direct a scope evaluation to exclude masses. Otherwise, unremarkable temporal bones. No mastoid effusions. Electronically Signed   By: Feliberto Harts M.D.   On: 11/06/2023 15:05   CT Temporal Bones W Contrast Result Date: 11/06/2023 CLINICAL DATA:  Nasal abscess R facial swelling; Mastoiditis EXAM: CT MAXILLOFACIAL WITH CONTRAST; CT TEMPORAL BONES WITH CONTRAST TECHNIQUE: Axial and coronal plane CT imaging of the petrous temporal bones was performed with thin-collimation image reconstruction following intravenous contrast administration. Multiplanar CT image reconstructions were also generated. Multidetector CT imaging of the  maxillofacial structures was performed with intravenous contrast. Multiplanar CT image reconstructions were also generated. RADIATION DOSE REDUCTION: This exam was performed according to the departmental dose-optimization program which includes automated exposure control, adjustment of the mA and/or kV according to patient size and/or use of iterative reconstruction technique. CONTRAST:  75mL OMNIPAQUE IOHEXOL 300 MG/ML  SOLN COMPARISON:  None Available. FINDINGS: Maxillofacial CT: Osseous: No fracture or mandibular dislocation. No destructive process. Orbits: Negative. No traumatic or inflammatory finding. Sinuses: Clear. Soft tissues: Edematous and asymmetrically enlarged right parotid gland with surrounding edema. No visible discrete, drainable fluid collection. Edema extends inferiorly into the right neck with mildly prominent right cervical chain lymph nodes that are likely reactive. No visible parotid duct calculus or ductal dilation. Limited intracranial: No significant or unexpected finding. RIGHT TEMPORAL BONE External auditory canal: Soft tissue opacifies the deep aspect of the external auditory canal without adjacent bony change. Middle ear cavity: Normally aerated. The scutum and ossicles are normal. The tegmen tympani is intact. Inner ear structures: The cochlea, vestibule and semicircular canals are normal. The vestibular aqueduct is not enlarged. Internal auditory and facial nerve canals:  Normal Mastoid air cells: Normally aerated. No osseous erosion. LEFT TEMPORAL BONE External auditory canal: Soft tissue opacifies the deep aspect of the external auditory canal without adjacent bony change. Middle ear cavity: Normally aerated. The scutum and ossicles are normal. The tegmen tympani is intact. Inner ear structures: The cochlea, vestibule and semicircular canals are normal. The vestibular aqueduct is not enlarged. Internal auditory and facial nerve canals:  Normal. Mastoid air cells: Normally aerated.  No osseous erosion. Vascular: Normal non-contrast appearance of the carotid canals, jugular bulbs and sigmoid plates. Limited intracranial:  No acute or significant  finding. Soft tissues: See maxillofacial CT above for further characterization IMPRESSION: 1. Edematous and asymmetrically enlarged right parotid gland with surrounding edema, suggestive of parotiditis and cellulitis. No abscess. No visible parotid duct calculus or ductal dilation. 2. Both external auditory canals are obstructed by soft tissue attenuation. This could be cerumen; however, recommend correlation with direct a scope evaluation to exclude masses. Otherwise, unremarkable temporal bones. No mastoid effusions. Electronically Signed   By: Feliberto Harts M.D.   On: 11/06/2023 15:05   Labs on Admission: I have personally reviewed following labs  CBC: Recent Labs  Lab 11/06/23 1216  WBC 19.6*  HGB 16.6  HCT 49.4  MCV 91.1  PLT 319   Basic Metabolic Panel: Recent Labs  Lab 11/06/23 1216  NA 132*  K 3.8  CL 99  CO2 25  GLUCOSE 109*  BUN 10  CREATININE 0.78  CALCIUM 8.3*   GFR: Estimated Creatinine Clearance: 122.8 mL/min (by C-G formula based on SCr of 0.78 mg/dL).  This document was prepared using Dragon Voice Recognition software and may include unintentional dictation errors.  Dr. Sedalia Muta [facility]  If 7PM-7AM, please contact overnight-coverage provider If 7AM-7PM, please contact day attending provider [email or web address]  11/06/2023, 5:17 PM

## 2023-11-06 NOTE — Assessment & Plan Note (Signed)
Secondary to acute infection, treat per above Metoprolol tartrate 5 mg IV every 4 hours as needed for heart rate greater than 115, 3 doses ordered

## 2023-11-06 NOTE — Assessment & Plan Note (Signed)
Extensive counseling for tobacco cessation

## 2023-11-06 NOTE — Assessment & Plan Note (Addendum)
With bilaterally inflamed tonsils, tonsillitis cannot be excluded at this time Check strep a PCR Continue with sodium chloride infusion at 125 mL/h, 1 day ordered Cepacol lozenges as needed for sore throat ordered

## 2023-11-06 NOTE — Consult Note (Signed)
CODE SEPSIS - PHARMACY COMMUNICATION  **Broad-spectrum antimicrobials should be administered within one hour of sepsis diagnosis**  Time Code Sepsis call or page was received: 1302  Antibiotics ordered: Zosyn, Vancomycin  Time of first antibiotic administration: 1334  Additional action taken by pharmacy: N/A  If necessary, name of provider/nurse contacted: N/A    Will M. Dareen Piano, PharmD Clinical Pharmacist 11/06/2023 1:50 PM

## 2023-11-06 NOTE — Consult Note (Signed)
Pharmacy Antibiotic Note  ASSESSMENT: 32 y.o. male with PMH including tobacco use is presenting with  facial swelling. Imaging concerning for parotidis and cellulitis . Pharmacy has been consulted to manage cefepime and vancomycin dosing.  Patient measurements: Height: 5\' 7"  (170.2 cm) Weight: 72.6 kg (160 lb) IBW/kg (Calculated) : 66.1  Vital signs: Temp: 98.4 F (36.9 C) (12/13 1624) Temp Source: Axillary (12/13 1624) BP: 137/90 (12/13 1214) Pulse Rate: 126 (12/13 1214) Recent Labs  Lab 11/06/23 1216  WBC 19.6*  CREATININE 0.78   Estimated Creatinine Clearance: 122.8 mL/min (by C-G formula based on SCr of 0.78 mg/dL).  Allergies: No Known Allergies  Antimicrobials this admission: Zosyn 12/13 >>  Vancomycin 12/13 >>  Dose adjustments this admission: N/A  Microbiology results: 12/13 BCx: sent 12/13 MRSA PCR: negative  PLAN: Initiate Zosyn 3.375 g IV q8H Administer vancomycin 1500 mg x 1 as a load, followed by 1250 mg IV q12H thereafter eAUC 450, Cmax 33, Cmin 11 Scr 0.8, IBW, Vd 0.72 L/kg Follow up culture results to assess for antibiotic optimization. Monitor renal function to assess for any necessary antibiotic dosing changes.   Thank you for allowing pharmacy to be a part of this patient's care.  Will M. Dareen Piano, PharmD Clinical Pharmacist 11/06/2023 4:36 PM

## 2023-11-06 NOTE — ED Notes (Signed)
Pt to CT

## 2023-11-06 NOTE — Consult Note (Signed)
PHARMACY - BRIEF ANTIBIOTIC NOTE   Pharmacy has received consult(s) for vancomycin from an ED provider. The patient's profile has been reviewed for ht/wt/allergies/indication/available labs.    One time order(s) placed for: vancomycin 1500 mg IV  Further antibiotics/pharmacy consults should be ordered by admitting physician if indicated.                       Thank you,  Will M. Dareen Piano, PharmD Clinical Pharmacist 11/06/2023 3:34 PM

## 2023-11-06 NOTE — Sepsis Progress Note (Signed)
Sepsis protocol monitored by eLink ?

## 2023-11-06 NOTE — Hospital Course (Signed)
Mr. Leon Williams is a 32 year old male with history of tobacco use, who presents emergency department for chief concerns of right facial swelling.  Vitals in the ED showed temperature of 98.7, respiration rate 19, heart rate 126, blood pressure 137/90, SpO2 of 100% on room air.  Serum sodium is 132, potassium 3.8, chloride 99, bicarb 25, BUN of 10, serum creatinine 0.78, EGFR greater than 60, nonfasting blood glucose 109, WBC 19.6, hemoglobin 16.6, platelets of 319.  Lactic acid is 0.8, on repeat was 0.7.  ED treatment: Dilaudid 0.5 mg IV one-time dose, ondansetron 4 mg IV one-time dose, Zosyn, vancomycin, sodium chloride 1 L bolus.

## 2023-11-06 NOTE — Assessment & Plan Note (Addendum)
     Check MRSA PCR, strep a Continue with vancomycin and Zosyn Lactic acid is reassuringly negative Admit to telemetry medical, inpatient

## 2023-11-06 NOTE — Assessment & Plan Note (Signed)
Suspect secondary to parotitis, treat per above Blood cultures x 2 have been ordered Check CBC in the a.m.

## 2023-11-07 LAB — CBC
HCT: 40.9 % (ref 39.0–52.0)
Hemoglobin: 13.9 g/dL (ref 13.0–17.0)
MCH: 31 pg (ref 26.0–34.0)
MCHC: 34 g/dL (ref 30.0–36.0)
MCV: 91.1 fL (ref 80.0–100.0)
Platelets: 259 10*3/uL (ref 150–400)
RBC: 4.49 MIL/uL (ref 4.22–5.81)
RDW: 13.6 % (ref 11.5–15.5)
WBC: 21.6 10*3/uL — ABNORMAL HIGH (ref 4.0–10.5)
nRBC: 0 % (ref 0.0–0.2)

## 2023-11-07 LAB — BASIC METABOLIC PANEL
Anion gap: 9 (ref 5–15)
BUN: 7 mg/dL (ref 6–20)
CO2: 20 mmol/L — ABNORMAL LOW (ref 22–32)
Calcium: 7.5 mg/dL — ABNORMAL LOW (ref 8.9–10.3)
Chloride: 102 mmol/L (ref 98–111)
Creatinine, Ser: 0.8 mg/dL (ref 0.61–1.24)
GFR, Estimated: >60 mL/min (ref >60)
Glucose, Bld: 87 mg/dL (ref 70–99)
Potassium: 3.7 mmol/L (ref 3.5–5.1)
Sodium: 131 mmol/L — ABNORMAL LOW (ref 135–145)

## 2023-11-07 MED ORDER — HYDROMORPHONE HCL 1 MG/ML IJ SOLN: 0.5000 mg | INTRAMUSCULAR | Status: DC | PRN

## 2023-11-07 MED ORDER — OXYCODONE HCL 5 MG PO TABS
5.0000 mg | ORAL_TABLET | ORAL | Status: DC | PRN
  Administered 2023-11-07 – 2023-11-08 (×5): 10 mg via ORAL
  Filled 2023-11-07 (×6): qty 2

## 2023-11-07 MED ORDER — DEXAMETHASONE SODIUM PHOSPHATE 10 MG/ML IJ SOLN
4.0000 mg | Freq: Four times a day (QID) | INTRAMUSCULAR | Status: DC
  Administered 2023-11-07 – 2023-11-08 (×4): 4 mg via INTRAVENOUS
  Filled 2023-11-07 (×4): qty 1

## 2023-11-07 MED ORDER — SODIUM CHLORIDE 0.9 % IV SOLN
3.0000 g | Freq: Four times a day (QID) | INTRAVENOUS | Status: DC
  Administered 2023-11-07 – 2023-11-09 (×8): 3 g via INTRAVENOUS
  Filled 2023-11-07 (×10): qty 8

## 2023-11-07 NOTE — Consult Note (Signed)
Pharmacy Antibiotic Note  ASSESSMENT: 32 y.o. male with PMH including tobacco use is presenting with  facial swelling. Imaging concerning for parotidis and cellulitis . Pharmacy has been consulted to manage Unasyn dosing.   Patient measurements: Height: 5\' 7"  (170.2 cm) Weight: 72.6 kg (160 lb) IBW/kg (Calculated) : 66.1  Vital signs: Temp: 98.2 F (36.8 C) (12/14 0749) Temp Source: Oral (12/14 0749) BP: 119/67 (12/14 0749) Pulse Rate: 102 (12/14 0749) Recent Labs  Lab 11/06/23 1216 11/07/23 0221  WBC 19.6* 21.6*  CREATININE 0.78 0.80   Estimated Creatinine Clearance: 122.8 mL/min (by C-G formula based on SCr of 0.8 mg/dL).  Allergies: No Known Allergies  Antimicrobials this admission: Zosyn 12/13 >> 12/14 Vancomycin 12/13 >> 12/14  Unasyn 12/14 >>   Dose adjustments this admission: N/A  Microbiology results: 12/13 BCx: sent 12/13 MRSA PCR: negative  PLAN: Initiate Unasyn 3 gm IV Q6H based on current renal function  Follow up culture results to assess for antibiotic optimization  Monitor renal function to assess for any necessary antibiotic dosing changes.  Thank you for allowing pharmacy to be a part of this patient's care.  Littie Deeds, PharmD Pharmacy Resident  11/07/2023 8:29 AM

## 2023-11-07 NOTE — Progress Notes (Signed)
PROGRESS NOTE    JATAVIS SCHELLINGER  WUJ:[id number] DOB: 21-Sep-1991 DOA: 11/06/2023 PCP: Pcp, No    Brief Narrative:  32 year old male with history of tobacco use, who presents emergency department for chief concerns of right facial swelling.  CT evidence of right parotiditis without evidence of abscess or fluid collection.   Assessment & Plan:   Principal Problem:   Parotiditis Active Problems:   Smoker   Leukocytosis   Sore throat   Sinus tachycardia  *Right-sided parotiditis Patient presents with several day history of right-sided facial swelling progressing to difficulty swallowing.  Maintaining airway.  No shortness of breath.  No oxygen requirement.  Lactic acid is normal.  MRSA PCR and group A strep both negative. Plan: De-escalate antibiotic coverage to Unasyn IV Decadron 4 mg every 6 hours Aggressive IV fluid resuscitation Full liquid diet Pain control as needed Monitor vitals and fever curve  Sinus tachycardia Secondary to acute infection, treat per above    Sore throat With bilaterally inflamed tonsils, tonsillitis cannot be excluded at this time Less likely.  Strep a PCR negative Plan: IVF Cepacol lozenges   Leukocytosis Suspect secondary to parotitis, treat per above Blood cultures x 2 have been ordered, no growth to date Daily labs   Tobacco abuse Extensive counseling for tobacco cessation Likely underlying current presentation  DVT prophylaxis: Lovenox Code Status: Full Family Communication: Family member at bedside 12/14 Disposition Plan: Status is: Inpatient Remains inpatient appropriate because: Right-sided parotiditis on IV antibiotics   Level of care: Telemetry Medical  Consultants:  None  Procedures:  None  Antimicrobials: Unasyn   Subjective: Seen and examined.  Resting in bed.  Reports right-sided facial pain.  No other complaints.  Objective: Vitals:   11/07/23 0400 11/07/23 0749 11/07/23 1014 11/07/23 1200  BP:  119/67  118/74 135/66  Pulse:  (!) 102 100 92  Resp:  (!) 21 12 15   Temp: 98.7 F (37.1 C) 98.2 F (36.8 C) 98.4 F (36.9 C)   TempSrc: Oral Oral Oral   SpO2:  100% 100% 97%  Weight:      Height:        Intake/Output Summary (Last 24 hours) at 11/07/2023 1259 Last data filed at 11/06/2023 2113 Gross per 24 hour  Intake 150 ml  Output --  Net 150 ml   Filed Weights   11/06/23 1451  Weight: 72.6 kg    Examination:  General exam: He is uncomfortable and fatigued Respiratory system: Lungs clear.  Normal work of breathing.  Room air Cardiovascular system: 1 S2, RRR, no murmurs, no pedal edema Gastrointestinal system: Soft, NT/ND, normal bowel sounds Central nervous system: Alert and oriented. No focal neurological deficits. Extremities: Symmetric 5 x 5 power. Skin: Right side facial swelling Psychiatry: Judgement and insight appear normal. Mood & affect appropriate.     Data Reviewed: I have personally reviewed following labs and imaging studies  CBC: Recent Labs  Lab 11/06/23 1216 11/07/23 0221  WBC 19.6* 21.6*  HGB 16.6 13.9  HCT 49.4 40.9  MCV 91.1 91.1  PLT 319 259   Basic Metabolic Panel: Recent Labs  Lab 11/06/23 1216 11/07/23 0221  NA 132* 131*  K 3.8 3.7  CL 99 102  CO2 25 20*  GLUCOSE 109* 87  BUN 10 7  CREATININE 0.78 0.80  CALCIUM 8.3* 7.5*   GFR: Estimated Creatinine Clearance: 122.8 mL/min (by C-G formula based on SCr of 0.8 mg/dL). Liver Function Tests: No results for input(s): "AST", "ALT", "ALKPHOS", "BILITOT", "PROT", "  ALBUMIN" in the last 168 hours. No results for input(s): "LIPASE", "AMYLASE" in the last 168 hours. No results for input(s): "AMMONIA" in the last 168 hours. Coagulation Profile: No results for input(s): "INR", "PROTIME" in the last 168 hours. Cardiac Enzymes: No results for input(s): "CKTOTAL", "CKMB", "CKMBINDEX", "TROPONINI" in the last 168 hours. BNP (last 3 results) No results for input(s): "PROBNP" in the last 8760  hours. HbA1C: No results for input(s): "HGBA1C" in the last 72 hours. CBG: No results for input(s): "GLUCAP" in the last 168 hours. Lipid Profile: No results for input(s): "CHOL", "HDL", "LDLCALC", "TRIG", "CHOLHDL", "LDLDIRECT" in the last 72 hours. Thyroid Function Tests: No results for input(s): "TSH", "T4TOTAL", "FREET4", "T3FREE", "THYROIDAB" in the last 72 hours. Anemia Panel: No results for input(s): "VITAMINB12", "FOLATE", "FERRITIN", "TIBC", "IRON", "RETICCTPCT" in the last 72 hours. Sepsis Labs: Recent Labs  Lab 11/06/23 1216 11/06/23 1311  LATICACIDVEN 0.8 0.7    Recent Results (from the past 240 hours)  Blood culture (routine x 2)     Status: None (Preliminary result)   Collection Time: 11/06/23  1:11 PM   Specimen: BLOOD  Result Value Ref Range Status   Specimen Description BLOOD LEFT ANTECUBITAL  Final   Special Requests   Final    BOTTLES DRAWN AEROBIC AND ANAEROBIC Blood Culture adequate volume   Culture   Final    NO GROWTH < 24 HOURS Performed at [facility], [address] [place], [place] [address]    Report Status PENDING  Incomplete  Blood culture (routine x 2)     Status: None (Preliminary result)   Collection Time: 11/06/23  1:11 PM   Specimen: BLOOD  Result Value Ref Range Status   Specimen Description BLOOD RIGHT ANTECUBITAL  Final   Special Requests   Final    BOTTLES DRAWN AEROBIC AND ANAEROBIC Blood Culture adequate volume   Culture   Final    NO GROWTH < 24 HOURS Performed at [facility], [address] [place], [place] [address]    Report Status PENDING  Incomplete  MRSA Next Gen by PCR, Nasal     Status: None   Collection Time: 11/06/23  4:18 PM   Specimen: Nasal Mucosa; Nasal Swab  Result Value Ref Range Status   MRSA by PCR Next Gen NOT DETECTED NOT DETECTED Final    Comment: (NOTE) The GeneXpert MRSA Assay (FDA approved for NASAL specimens only), is one component of a comprehensive MRSA colonization  surveillance program. It is not intended to diagnose MRSA infection nor to guide or monitor treatment for MRSA infections. Test performance is not FDA approved in patients less than 81 years old. Performed at [facility], [address] [place], [place] [address]   Group A Strep by PCR     Status: None   Collection Time: 11/06/23  4:18 PM   Specimen: Nasal Mucosa; Sterile Swab  Result Value Ref Range Status   Group A Strep by PCR NOT DETECTED NOT DETECTED Final    Comment: Performed at [facility], [address], [place], [place] [address]         Radiology Studies: CT Maxillofacial W Contrast Result Date: 11/06/2023 CLINICAL DATA:  Nasal abscess R facial swelling; Mastoiditis EXAM: CT MAXILLOFACIAL WITH CONTRAST; CT TEMPORAL BONES WITH CONTRAST TECHNIQUE: Axial and coronal plane CT imaging of the petrous temporal bones was performed with thin-collimation image reconstruction following intravenous contrast administration. Multiplanar CT image reconstructions were also generated. Multidetector CT imaging of  the maxillofacial structures was performed with intravenous contrast. Multiplanar CT image reconstructions were also generated. RADIATION DOSE REDUCTION: This exam was performed according to the departmental dose-optimization program which includes automated exposure control, adjustment of the mA and/or kV according to patient size and/or use of iterative reconstruction technique. CONTRAST:  75mL OMNIPAQUE IOHEXOL 300 MG/ML  SOLN COMPARISON:  None Available. FINDINGS: Maxillofacial CT: Osseous: No fracture or mandibular dislocation. No destructive process. Orbits: Negative. No traumatic or inflammatory finding. Sinuses: Clear. Soft tissues: Edematous and asymmetrically enlarged right parotid gland with surrounding edema. No visible discrete, drainable fluid collection. Edema extends inferiorly into the right neck with mildly prominent right cervical chain lymph nodes  that are likely reactive. No visible parotid duct calculus or ductal dilation. Limited intracranial: No significant or unexpected finding. RIGHT TEMPORAL BONE External auditory canal: Soft tissue opacifies the deep aspect of the external auditory canal without adjacent bony change. Middle ear cavity: Normally aerated. The scutum and ossicles are normal. The tegmen tympani is intact. Inner ear structures: The cochlea, vestibule and semicircular canals are normal. The vestibular aqueduct is not enlarged. Internal auditory and facial nerve canals:  Normal Mastoid air cells: Normally aerated. No osseous erosion. LEFT TEMPORAL BONE External auditory canal: Soft tissue opacifies the deep aspect of the external auditory canal without adjacent bony change. Middle ear cavity: Normally aerated. The scutum and ossicles are normal. The tegmen tympani is intact. Inner ear structures: The cochlea, vestibule and semicircular canals are normal. The vestibular aqueduct is not enlarged. Internal auditory and facial nerve canals:  Normal. Mastoid air cells: Normally aerated. No osseous erosion. Vascular: Normal non-contrast appearance of the carotid canals, jugular bulbs and sigmoid plates. Limited intracranial:  No acute or significant finding. Soft tissues: See maxillofacial CT above for further characterization IMPRESSION: 1. Edematous and asymmetrically enlarged right parotid gland with surrounding edema, suggestive of parotiditis and cellulitis. No abscess. No visible parotid duct calculus or ductal dilation. 2. Both external auditory canals are obstructed by soft tissue attenuation. This could be cerumen; however, recommend correlation with direct a scope evaluation to exclude masses. Otherwise, unremarkable temporal bones. No mastoid effusions. Electronically Signed   By: Feliberto Harts M.D.   On: 11/06/2023 15:05   CT Temporal Bones W Contrast Result Date: 11/06/2023 CLINICAL DATA:  Nasal abscess R facial swelling;  Mastoiditis EXAM: CT MAXILLOFACIAL WITH CONTRAST; CT TEMPORAL BONES WITH CONTRAST TECHNIQUE: Axial and coronal plane CT imaging of the petrous temporal bones was performed with thin-collimation image reconstruction following intravenous contrast administration. Multiplanar CT image reconstructions were also generated. Multidetector CT imaging of the maxillofacial structures was performed with intravenous contrast. Multiplanar CT image reconstructions were also generated. RADIATION DOSE REDUCTION: This exam was performed according to the departmental dose-optimization program which includes automated exposure control, adjustment of the mA and/or kV according to patient size and/or use of iterative reconstruction technique. CONTRAST:  75mL OMNIPAQUE IOHEXOL 300 MG/ML  SOLN COMPARISON:  None Available. FINDINGS: Maxillofacial CT: Osseous: No fracture or mandibular dislocation. No destructive process. Orbits: Negative. No traumatic or inflammatory finding. Sinuses: Clear. Soft tissues: Edematous and asymmetrically enlarged right parotid gland with surrounding edema. No visible discrete, drainable fluid collection. Edema extends inferiorly into the right neck with mildly prominent right cervical chain lymph nodes that are likely reactive. No visible parotid duct calculus or ductal dilation. Limited intracranial: No significant or unexpected finding. RIGHT TEMPORAL BONE External auditory canal: Soft tissue opacifies the deep aspect of the external auditory canal without adjacent bony change. Middle  ear cavity: Normally aerated. The scutum and ossicles are normal. The tegmen tympani is intact. Inner ear structures: The cochlea, vestibule and semicircular canals are normal. The vestibular aqueduct is not enlarged. Internal auditory and facial nerve canals:  Normal Mastoid air cells: Normally aerated. No osseous erosion. LEFT TEMPORAL BONE External auditory canal: Soft tissue opacifies the deep aspect of the external  auditory canal without adjacent bony change. Middle ear cavity: Normally aerated. The scutum and ossicles are normal. The tegmen tympani is intact. Inner ear structures: The cochlea, vestibule and semicircular canals are normal. The vestibular aqueduct is not enlarged. Internal auditory and facial nerve canals:  Normal. Mastoid air cells: Normally aerated. No osseous erosion. Vascular: Normal non-contrast appearance of the carotid canals, jugular bulbs and sigmoid plates. Limited intracranial:  No acute or significant finding. Soft tissues: See maxillofacial CT above for further characterization IMPRESSION: 1. Edematous and asymmetrically enlarged right parotid gland with surrounding edema, suggestive of parotiditis and cellulitis. No abscess. No visible parotid duct calculus or ductal dilation. 2. Both external auditory canals are obstructed by soft tissue attenuation. This could be cerumen; however, recommend correlation with direct a scope evaluation to exclude masses. Otherwise, unremarkable temporal bones. No mastoid effusions. Electronically Signed   By: Feliberto Harts M.D.   On: 11/06/2023 15:05        Scheduled Meds:  dexamethasone (DECADRON) injection  4 mg Intravenous Q6H   enoxaparin (LOVENOX) injection  40 mg Subcutaneous Q24H   Continuous Infusions:  sodium chloride 125 mL/hr at 11/06/23 1618   ampicillin-sulbactam (UNASYN) IV       LOS: 1 day       Tresa Moore, MD [facility]   If 7PM-7AM, please contact night-coverage  11/07/2023, 12:59 PM

## 2023-11-08 DIAGNOSIS — J029 Acute pharyngitis, unspecified: Secondary | ICD-10-CM

## 2023-11-08 DIAGNOSIS — D72829 Elevated white blood cell count, unspecified: Secondary | ICD-10-CM

## 2023-11-08 DIAGNOSIS — R000 Tachycardia, unspecified: Secondary | ICD-10-CM

## 2023-11-08 LAB — CBC WITH DIFFERENTIAL/PLATELET
Abs Immature Granulocytes: 0.13 10*3/uL — ABNORMAL HIGH (ref 0.00–0.07)
Basophils Absolute: 0 10*3/uL (ref 0.0–0.1)
Basophils Relative: 0 %
Eosinophils Absolute: 0 10*3/uL (ref 0.0–0.5)
Eosinophils Relative: 0 %
HCT: 42.4 % (ref 39.0–52.0)
Hemoglobin: 14.9 g/dL (ref 13.0–17.0)
Immature Granulocytes: 1 %
Lymphocytes Relative: 6 %
Lymphs Abs: 1.1 10*3/uL (ref 0.7–4.0)
MCH: 30.8 pg (ref 26.0–34.0)
MCHC: 35.1 g/dL (ref 30.0–36.0)
MCV: 87.6 fL (ref 80.0–100.0)
Monocytes Absolute: 0.3 10*3/uL (ref 0.1–1.0)
Monocytes Relative: 2 %
Neutro Abs: 16.9 10*3/uL — ABNORMAL HIGH (ref 1.7–7.7)
Neutrophils Relative %: 91 %
Platelets: 279 10*3/uL (ref 150–400)
RBC: 4.84 MIL/uL (ref 4.22–5.81)
RDW: 13.1 % (ref 11.5–15.5)
WBC: 18.4 10*3/uL — ABNORMAL HIGH (ref 4.0–10.5)
nRBC: 0 % (ref 0.0–0.2)

## 2023-11-08 LAB — BASIC METABOLIC PANEL
Anion gap: 9 (ref 5–15)
BUN: 12 mg/dL (ref 6–20)
CO2: 25 mmol/L (ref 22–32)
Calcium: 8.4 mg/dL — ABNORMAL LOW (ref 8.9–10.3)
Chloride: 102 mmol/L (ref 98–111)
Creatinine, Ser: 0.65 mg/dL (ref 0.61–1.24)
GFR, Estimated: >60 mL/min (ref >60)
Glucose, Bld: 143 mg/dL — ABNORMAL HIGH (ref 70–99)
Potassium: 4.1 mmol/L (ref 3.5–5.1)
Sodium: 136 mmol/L (ref 135–145)

## 2023-11-08 MED ORDER — DEXAMETHASONE SODIUM PHOSPHATE 10 MG/ML IJ SOLN
4.0000 mg | Freq: Two times a day (BID) | INTRAMUSCULAR | Status: DC
  Administered 2023-11-08: 4 mg via INTRAVENOUS
  Filled 2023-11-08: qty 1

## 2023-11-08 MED ORDER — OXYCODONE HCL 5 MG PO TABS: 5.0000 mg | ORAL_TABLET | Freq: Four times a day (QID) | ORAL | Status: DC | PRN

## 2023-11-08 MED ORDER — OXYCODONE HCL 5 MG PO TABS
5.0000 mg | ORAL_TABLET | Freq: Four times a day (QID) | ORAL | Status: DC | PRN
  Administered 2023-11-08 (×2): 5 mg via ORAL
  Filled 2023-11-08 (×2): qty 1

## 2023-11-08 NOTE — Plan of Care (Signed)

## 2023-11-08 NOTE — Progress Notes (Signed)
PROGRESS NOTE    Leon Williams  UJW:[id number] DOB: 1991-05-24 DOA: 11/06/2023 PCP: Pcp, No    Brief Narrative:  32 year old male with history of tobacco use, who presents emergency department for chief concerns of right facial swelling.  CT evidence of right parotiditis without evidence of abscess or fluid collection.  12/15: Cutting back on narcotics.  Advancing diet to regular.  Cutting back on steroids   Assessment & Plan:   Principal Problem:   Parotiditis Active Problems:   Smoker   Leukocytosis   Sore throat   Sinus tachycardia  *Right-sided parotiditis Patient presents with several day history of right-sided facial swelling progressing to difficulty swallowing.  Maintaining airway.  No shortness of breath.  No oxygen requirement.  Lactic acid is normal.  MRSA PCR and group A strep both negative. Plan: Continue IV Unasyn IV Decadron 4 mg every 6 hours-> twice daily Aggressive IV fluid resuscitation Advance diet from full liquid to regular Cutting back on narcotics. Monitor vitals and fever curve  Sinus tachycardia Secondary to acute infection, treat per above  Sore throat With bilaterally inflamed tonsils, tonsillitis cannot be excluded at this time Less likely.  Strep a PCR negative Plan: Continue IVF Cepacol lozenges   Leukocytosis Suspect secondary to parotitis, treat per above Blood cultures x 2 have been ordered, no growth to date Daily labs   Tobacco abuse Extensive counseling for tobacco cessation Likely underlying current presentation  DVT prophylaxis: Lovenox Code Status: Full Family Communication: Family member up at bedside Disposition Plan: Status is: Inpatient Remains inpatient appropriate because: Right-sided parotiditis on IV antibiotics and steroids   Level of care: Telemetry Medical    Antimicrobials: Unasyn   Subjective:  Right-sided facial pain and swelling improving.  Tolerating diet  Objective: Vitals:   11/07/23  1600 11/07/23 1919 11/08/23 0309 11/08/23 0929  BP: 109/65 121/64 120/67 125/70  Pulse: 76 97 70 77  Resp: 20 18 20 18   Temp:  97.7 F (36.5 C) 97.7 F (36.5 C) 98.3 F (36.8 C)  TempSrc:  Axillary Oral Oral  SpO2: 95% 99% 99% 99%  Weight:      Height:        Intake/Output Summary (Last 24 hours) at 11/08/2023 0947 Last data filed at 11/08/2023 0600 Gross per 24 hour  Intake 1100 ml  Output --  Net 1100 ml   Filed Weights   11/06/23 1451  Weight: 72.6 kg    Examination:  General exam: He is uncomfortable and fatigued Respiratory system: Lungs clear.  Normal work of breathing.  Room air Cardiovascular system: S1 S2 normal, RRR, no murmurs, no pedal edema Gastrointestinal system: Soft, NT/ND, normal bowel sounds Central nervous system: Alert and oriented. No focal neurological deficits. Extremities: Symmetric 5 x 5 power. Skin: Right side facial swelling Psychiatry: Judgement and insight appear normal. Mood & affect appropriate.     Data Reviewed: I have personally reviewed following labs and imaging studies  CBC: Recent Labs  Lab 11/06/23 1216 11/07/23 0221 11/08/23 0527  WBC 19.6* 21.6* 18.4*  NEUTROABS  --   --  16.9*  HGB 16.6 13.9 14.9  HCT 49.4 40.9 42.4  MCV 91.1 91.1 87.6  PLT 319 259 279   Basic Metabolic Panel: Recent Labs  Lab 11/06/23 1216 11/07/23 0221 11/08/23 0527  NA 132* 131* 136  K 3.8 3.7 4.1  CL 99 102 102  CO2 25 20* 25  GLUCOSE 109* 87 143*  BUN 10 7 12   CREATININE 0.78 0.80 0.65  CALCIUM 8.3* 7.5* 8.4*    Sepsis Labs: Recent Labs  Lab 11/06/23 1216 11/06/23 1311  LATICACIDVEN 0.8 0.7    Recent Results (from the past 240 hours)  Blood culture (routine x 2)     Status: None (Preliminary result)   Collection Time: 11/06/23  1:11 PM   Specimen: BLOOD  Result Value Ref Range Status   Specimen Description BLOOD LEFT ANTECUBITAL  Final   Special Requests   Final    BOTTLES DRAWN AEROBIC AND ANAEROBIC Blood Culture  adequate volume   Culture   Final    NO GROWTH 2 DAYS Performed at [facility], [address], [place], [place] [address]    Report Status PENDING  Incomplete  Blood culture (routine x 2)     Status: None (Preliminary result)   Collection Time: 11/06/23  1:11 PM   Specimen: BLOOD  Result Value Ref Range Status   Specimen Description BLOOD RIGHT ANTECUBITAL  Final   Special Requests   Final    BOTTLES DRAWN AEROBIC AND ANAEROBIC Blood Culture adequate volume   Culture   Final    NO GROWTH 2 DAYS Performed at [facility], [address] [place], [place] [address]    Report Status PENDING  Incomplete  MRSA Next Gen by PCR, Nasal     Status: None   Collection Time: 11/06/23  4:18 PM   Specimen: Nasal Mucosa; Nasal Swab  Result Value Ref Range Status   MRSA by PCR Next Gen NOT DETECTED NOT DETECTED Final    Comment: (NOTE) The GeneXpert MRSA Assay (FDA approved for NASAL specimens only), is one component of a comprehensive MRSA colonization surveillance program. It is not intended to diagnose MRSA infection nor to guide or monitor treatment for MRSA infections. Test performance is not FDA approved in patients less than 47 years old. Performed at [facility], [address] [place], [place] [address]   Group A Strep by PCR     Status: None   Collection Time: 11/06/23  4:18 PM   Specimen: Nasal Mucosa; Sterile Swab  Result Value Ref Range Status   Group A Strep by PCR NOT DETECTED NOT DETECTED Final    Comment: Performed at [facility], [address] [place], [place] [address]         Radiology Studies: CT Maxillofacial W Contrast Result Date: 11/06/2023 CLINICAL DATA:  Nasal abscess R facial swelling; Mastoiditis EXAM: CT MAXILLOFACIAL WITH CONTRAST; CT TEMPORAL BONES WITH CONTRAST TECHNIQUE: Axial and coronal plane CT imaging of the petrous temporal bones was performed with thin-collimation image reconstruction following  intravenous contrast administration. Multiplanar CT image reconstructions were also generated. Multidetector CT imaging of the maxillofacial structures was performed with intravenous contrast. Multiplanar CT image reconstructions were also generated. RADIATION DOSE REDUCTION: This exam was performed according to the departmental dose-optimization program which includes automated exposure control, adjustment of the mA and/or kV according to patient size and/or use of iterative reconstruction technique. CONTRAST:  75mL OMNIPAQUE IOHEXOL 300 MG/ML  SOLN COMPARISON:  None Available. FINDINGS: Maxillofacial CT: Osseous: No fracture or mandibular dislocation. No destructive process. Orbits: Negative. No traumatic or inflammatory finding. Sinuses: Clear. Soft tissues: Edematous and asymmetrically enlarged right parotid gland with surrounding edema. No visible discrete, drainable fluid collection. Edema extends inferiorly into the right neck with mildly prominent right cervical chain lymph nodes that are likely reactive. No visible parotid duct calculus or ductal dilation. Limited intracranial: No significant or unexpected finding. RIGHT TEMPORAL  BONE External auditory canal: Soft tissue opacifies the deep aspect of the external auditory canal without adjacent bony change. Middle ear cavity: Normally aerated. The scutum and ossicles are normal. The tegmen tympani is intact. Inner ear structures: The cochlea, vestibule and semicircular canals are normal. The vestibular aqueduct is not enlarged. Internal auditory and facial nerve canals:  Normal Mastoid air cells: Normally aerated. No osseous erosion. LEFT TEMPORAL BONE External auditory canal: Soft tissue opacifies the deep aspect of the external auditory canal without adjacent bony change. Middle ear cavity: Normally aerated. The scutum and ossicles are normal. The tegmen tympani is intact. Inner ear structures: The cochlea, vestibule and semicircular canals are normal. The  vestibular aqueduct is not enlarged. Internal auditory and facial nerve canals:  Normal. Mastoid air cells: Normally aerated. No osseous erosion. Vascular: Normal non-contrast appearance of the carotid canals, jugular bulbs and sigmoid plates. Limited intracranial:  No acute or significant finding. Soft tissues: See maxillofacial CT above for further characterization IMPRESSION: 1. Edematous and asymmetrically enlarged right parotid gland with surrounding edema, suggestive of parotiditis and cellulitis. No abscess. No visible parotid duct calculus or ductal dilation. 2. Both external auditory canals are obstructed by soft tissue attenuation. This could be cerumen; however, recommend correlation with direct a scope evaluation to exclude masses. Otherwise, unremarkable temporal bones. No mastoid effusions. Electronically Signed   By: Feliberto Harts M.D.   On: 11/06/2023 15:05   CT Temporal Bones W Contrast Result Date: 11/06/2023 CLINICAL DATA:  Nasal abscess R facial swelling; Mastoiditis EXAM: CT MAXILLOFACIAL WITH CONTRAST; CT TEMPORAL BONES WITH CONTRAST TECHNIQUE: Axial and coronal plane CT imaging of the petrous temporal bones was performed with thin-collimation image reconstruction following intravenous contrast administration. Multiplanar CT image reconstructions were also generated. Multidetector CT imaging of the maxillofacial structures was performed with intravenous contrast. Multiplanar CT image reconstructions were also generated. RADIATION DOSE REDUCTION: This exam was performed according to the departmental dose-optimization program which includes automated exposure control, adjustment of the mA and/or kV according to patient size and/or use of iterative reconstruction technique. CONTRAST:  75mL OMNIPAQUE IOHEXOL 300 MG/ML  SOLN COMPARISON:  None Available. FINDINGS: Maxillofacial CT: Osseous: No fracture or mandibular dislocation. No destructive process. Orbits: Negative. No traumatic or  inflammatory finding. Sinuses: Clear. Soft tissues: Edematous and asymmetrically enlarged right parotid gland with surrounding edema. No visible discrete, drainable fluid collection. Edema extends inferiorly into the right neck with mildly prominent right cervical chain lymph nodes that are likely reactive. No visible parotid duct calculus or ductal dilation. Limited intracranial: No significant or unexpected finding. RIGHT TEMPORAL BONE External auditory canal: Soft tissue opacifies the deep aspect of the external auditory canal without adjacent bony change. Middle ear cavity: Normally aerated. The scutum and ossicles are normal. The tegmen tympani is intact. Inner ear structures: The cochlea, vestibule and semicircular canals are normal. The vestibular aqueduct is not enlarged. Internal auditory and facial nerve canals:  Normal Mastoid air cells: Normally aerated. No osseous erosion. LEFT TEMPORAL BONE External auditory canal: Soft tissue opacifies the deep aspect of the external auditory canal without adjacent bony change. Middle ear cavity: Normally aerated. The scutum and ossicles are normal. The tegmen tympani is intact. Inner ear structures: The cochlea, vestibule and semicircular canals are normal. The vestibular aqueduct is not enlarged. Internal auditory and facial nerve canals:  Normal. Mastoid air cells: Normally aerated. No osseous erosion. Vascular: Normal non-contrast appearance of the carotid canals, jugular bulbs and sigmoid plates. Limited intracranial:  No acute or  significant finding. Soft tissues: See maxillofacial CT above for further characterization IMPRESSION: 1. Edematous and asymmetrically enlarged right parotid gland with surrounding edema, suggestive of parotiditis and cellulitis. No abscess. No visible parotid duct calculus or ductal dilation. 2. Both external auditory canals are obstructed by soft tissue attenuation. This could be cerumen; however, recommend correlation with direct a  scope evaluation to exclude masses. Otherwise, unremarkable temporal bones. No mastoid effusions. Electronically Signed   By: Feliberto Harts M.D.   On: 11/06/2023 15:05        Scheduled Meds:  dexamethasone (DECADRON) injection  4 mg Intravenous Q6H   enoxaparin (LOVENOX) injection  40 mg Subcutaneous Q24H   Continuous Infusions:  ampicillin-sulbactam (UNASYN) IV 3 g (11/08/23 0916)     LOS: 2 days    Time spent 35 minutes   Delfino Lovett, MD [facility]   If 7PM-7AM, please contact night-coverage  11/08/2023, 9:47 AM

## 2023-11-08 NOTE — TOC CM/SW Note (Signed)
Patient is listed as uninsured. CSW attempted to meet with patient at bedside, patient sleeping. Left resources for [facility] and [facility] at bedside.  Alfonso Ramus, LCSW Transitions of Care Department [phone]

## 2023-11-09 DIAGNOSIS — A419 Sepsis, unspecified organism: Secondary | ICD-10-CM

## 2023-11-09 LAB — CBC
HCT: 41 % (ref 39.0–52.0)
Hemoglobin: 14.2 g/dL (ref 13.0–17.0)
MCH: 30.7 pg (ref 26.0–34.0)
MCHC: 34.6 g/dL (ref 30.0–36.0)
MCV: 88.7 fL (ref 80.0–100.0)
Platelets: 330 10*3/uL (ref 150–400)
RBC: 4.62 MIL/uL (ref 4.22–5.81)
RDW: 13.4 % (ref 11.5–15.5)
WBC: 20 10*3/uL — ABNORMAL HIGH (ref 4.0–10.5)
nRBC: 0 % (ref 0.0–0.2)

## 2023-11-09 LAB — BASIC METABOLIC PANEL
Anion gap: 9 (ref 5–15)
BUN: 18 mg/dL (ref 6–20)
CO2: 27 mmol/L (ref 22–32)
Calcium: 8.5 mg/dL — ABNORMAL LOW (ref 8.9–10.3)
Chloride: 104 mmol/L (ref 98–111)
Creatinine, Ser: 0.77 mg/dL (ref 0.61–1.24)
GFR, Estimated: >60 mL/min (ref >60)
Glucose, Bld: 153 mg/dL — ABNORMAL HIGH (ref 70–99)
Potassium: 4 mmol/L (ref 3.5–5.1)
Sodium: 140 mmol/L (ref 135–145)

## 2023-11-09 MED ORDER — PREDNISONE 10 MG (21) PO TBPK: ORAL_TABLET | ORAL | 0 refills | Status: DC

## 2023-11-09 MED ORDER — AMOXICILLIN-POT CLAVULANATE 500-125 MG PO TABS: 1.0000 | ORAL_TABLET | Freq: Three times a day (TID) | ORAL | 0 refills | Status: AC

## 2023-11-09 NOTE — Plan of Care (Signed)

## 2023-11-09 NOTE — Plan of Care (Signed)
  Problem: Education: Goal: Knowledge of General Education information will improve Description: Including pain rating scale, medication(s)/side effects and non-pharmacologic comfort measures Outcome: Adequate for Discharge   Problem: Health Behavior/Discharge Planning: Goal: Ability to manage health-related needs will improve Outcome: Adequate for Discharge   Problem: Clinical Measurements: Goal: Ability to maintain clinical measurements within normal limits will improve Outcome: Adequate for Discharge Goal: Will remain free from infection Outcome: Adequate for Discharge Goal: Diagnostic test results will improve Outcome: Adequate for Discharge Goal: Respiratory complications will improve Outcome: Adequate for Discharge Goal: Cardiovascular complication will be avoided Outcome: Adequate for Discharge   Problem: Activity: Goal: Risk for activity intolerance will decrease Outcome: Adequate for Discharge   Problem: Nutrition: Goal: Adequate nutrition will be maintained Outcome: Adequate for Discharge   Problem: Coping: Goal: Level of anxiety will decrease Outcome: Adequate for Discharge   Problem: Elimination: Goal: Will not experience complications related to bowel motility Outcome: Adequate for Discharge Goal: Will not experience complications related to urinary retention Outcome: Adequate for Discharge   Problem: Pain Management: Goal: General experience of comfort will improve Outcome: Adequate for Discharge   Problem: Safety: Goal: Ability to remain free from injury will improve Outcome: Adequate for Discharge   Problem: Skin Integrity: Goal: Risk for impaired skin integrity will decrease Outcome: Adequate for Discharge    Pt aox4, respirations even and unlabored. Pt has all belongings. Pt has received all DC instructions. Pt being taken back home by father

## 2023-11-10 NOTE — Discharge Summary (Addendum)
Physician Discharge Summary   Patient: Leon Williams MRN: [id number] DOB: 04-Jul-1991  Admit date:     11/06/2023  Discharge date: 11/09/23  Discharge Physician: Delfino Lovett   PCP: Pcp, No   Recommendations at discharge:   Follow-up with outpatient providers as requested  Discharge Diagnoses: Principal Problem:   Parotiditis Active Problems:   Smoker   Leukocytosis   Sore throat   Sinus tachycardia   Sepsis [facility])  Hospital Course: Leon Williams is a 32 year old male with history of tobacco use, who presents emergency department for chief concerns of right facial swelling.  Vitals in the ED showed temperature of 98.7, respiration rate 19, heart rate 126, blood pressure 137/90, SpO2 of 100% on room air.  Serum sodium is 132, potassium 3.8, chloride 99, bicarb 25, BUN of 10, serum creatinine 0.78, EGFR greater than 60, nonfasting blood glucose 109, WBC 19.6, hemoglobin 16.6, platelets of 319.  Lactic acid is 0.8, on repeat was 0.7.  ED treatment: Dilaudid 0.5 mg IV one-time dose, ondansetron 4 mg IV one-time dose, Zosyn, vancomycin, sodium chloride 1 L bolus.  Assessment and Plan:  32 year old male with history of tobacco use, who presents emergency department for chief concerns of right facial swelling.  CT evidence of right parotiditis without evidence of abscess or fluid collection.   12/15: Cutting back on narcotics.  Advancing diet to regular.  Cutting back on steroids    *Right-sided parotiditis Patient presents with several day history of right-sided facial swelling progressing to difficulty swallowing.  Maintaining airway.  No shortness of breath.  No oxygen requirement.  Lactic acid is normal.  MRSA PCR and group A strep both negative. Treated with IV Unasyn and IV Decadron Improved significantly.  Swelling is almost gone.  He is able to tolerate diet and not having any fever.  He is requesting discharge.  Being discharged on oral antibiotics and  steroids Recommended follow-up with [facility].  He and his family has been following there so they know the practice  Sinus tachycardia Secondary to acute infection, now resolved   Sore throat With bilaterally inflamed tonsils, tonsillitis cannot be excluded at this time Less likely.  Strep a PCR negative.  Now symptoms resolved Outpatient follow-up with ENT   Leukocytosis Suspect secondary to parotitis, Blood cultures x 2 have been ordered, no growth to date Now resolved   Tobacco abuse Extensive counseling for tobacco cessation Likely underlying current presentation       Disposition: Home Diet recommendation:  Discharge Diet Orders (From admission, onward)     Start     Ordered   11/09/23 0000  Diet - low sodium heart healthy        11/09/23 0906           Carb modified diet DISCHARGE MEDICATION: Allergies as of 11/09/2023   No Known Allergies      Medication List     STOP taking these medications    doxycycline 100 MG capsule Commonly known as: VIBRAMYCIN   mupirocin cream 2 % Commonly known as: BACTROBAN       TAKE these medications    amoxicillin-clavulanate 500-125 MG tablet Commonly known as: Augmentin Take 1 tablet by mouth 3 (three) times daily for 7 days.   predniSONE 10 MG (21) Tbpk tablet Commonly known as: STERAPRED UNI-PAK 21 TAB Start 60 mg po daily, taper 10 mg daily until finish        Follow-up Information     [facility]  [place]. Schedule an appointment as soon as possible for a visit in 1 week(s).   Specialty: Primary Care Why: [facility] Discharge F/UP. Office is closed please call to  make your follow up. Contact information: [address] [place] [address] [phone]               Discharge Exam: Ceasar Mons Weights   11/06/23 1451  Weight: 72.6 kg    General exam: He is uncomfortable and fatigued Respiratory system: Lungs clear.  Normal work of breathing.  Room  air Cardiovascular system: S1 S2 normal, RRR, no murmurs, no pedal edema Gastrointestinal system: Soft, NT/ND, normal bowel sounds Central nervous system: Alert and oriented. No focal neurological deficits. Extremities: Symmetric 5 x 5 power. Skin: Right side facial swelling Psychiatry: Judgement and insight appear normal. Mood & affect appropriate.   Condition at discharge: good  The results of significant diagnostics from this hospitalization (including imaging, microbiology, ancillary and laboratory) are listed below for reference.   Imaging Studies: CT Maxillofacial W Contrast Result Date: 11/06/2023 CLINICAL DATA:  Nasal abscess R facial swelling; Mastoiditis EXAM: CT MAXILLOFACIAL WITH CONTRAST; CT TEMPORAL BONES WITH CONTRAST TECHNIQUE: Axial and coronal plane CT imaging of the petrous temporal bones was performed with thin-collimation image reconstruction following intravenous contrast administration. Multiplanar CT image reconstructions were also generated. Multidetector CT imaging of the maxillofacial structures was performed with intravenous contrast. Multiplanar CT image reconstructions were also generated. RADIATION DOSE REDUCTION: This exam was performed according to the departmental dose-optimization program which includes automated exposure control, adjustment of the mA and/or kV according to patient size and/or use of iterative reconstruction technique. CONTRAST:  75mL OMNIPAQUE IOHEXOL 300 MG/ML  SOLN COMPARISON:  None Available. FINDINGS: Maxillofacial CT: Osseous: No fracture or mandibular dislocation. No destructive process. Orbits: Negative. No traumatic or inflammatory finding. Sinuses: Clear. Soft tissues: Edematous and asymmetrically enlarged right parotid gland with surrounding edema. No visible discrete, drainable fluid collection. Edema extends inferiorly into the right neck with mildly prominent right cervical chain lymph nodes that are likely reactive. No visible parotid  duct calculus or ductal dilation. Limited intracranial: No significant or unexpected finding. RIGHT TEMPORAL BONE External auditory canal: Soft tissue opacifies the deep aspect of the external auditory canal without adjacent bony change. Middle ear cavity: Normally aerated. The scutum and ossicles are normal. The tegmen tympani is intact. Inner ear structures: The cochlea, vestibule and semicircular canals are normal. The vestibular aqueduct is not enlarged. Internal auditory and facial nerve canals:  Normal Mastoid air cells: Normally aerated. No osseous erosion. LEFT TEMPORAL BONE External auditory canal: Soft tissue opacifies the deep aspect of the external auditory canal without adjacent bony change. Middle ear cavity: Normally aerated. The scutum and ossicles are normal. The tegmen tympani is intact. Inner ear structures: The cochlea, vestibule and semicircular canals are normal. The vestibular aqueduct is not enlarged. Internal auditory and facial nerve canals:  Normal. Mastoid air cells: Normally aerated. No osseous erosion. Vascular: Normal non-contrast appearance of the carotid canals, jugular bulbs and sigmoid plates. Limited intracranial:  No acute or significant finding. Soft tissues: See maxillofacial CT above for further characterization IMPRESSION: 1. Edematous and asymmetrically enlarged right parotid gland with surrounding edema, suggestive of parotiditis and cellulitis. No abscess. No visible parotid duct calculus or ductal dilation. 2. Both external auditory canals are obstructed by soft tissue attenuation. This could be cerumen; however, recommend correlation with direct a scope evaluation to exclude masses. Otherwise, unremarkable temporal bones. No mastoid effusions.  Electronically Signed   By: Feliberto Harts M.D.   On: 11/06/2023 15:05   CT Temporal Bones W Contrast Result Date: 11/06/2023 CLINICAL DATA:  Nasal abscess R facial swelling; Mastoiditis EXAM: CT MAXILLOFACIAL WITH CONTRAST;  CT TEMPORAL BONES WITH CONTRAST TECHNIQUE: Axial and coronal plane CT imaging of the petrous temporal bones was performed with thin-collimation image reconstruction following intravenous contrast administration. Multiplanar CT image reconstructions were also generated. Multidetector CT imaging of the maxillofacial structures was performed with intravenous contrast. Multiplanar CT image reconstructions were also generated. RADIATION DOSE REDUCTION: This exam was performed according to the departmental dose-optimization program which includes automated exposure control, adjustment of the mA and/or kV according to patient size and/or use of iterative reconstruction technique. CONTRAST:  75mL OMNIPAQUE IOHEXOL 300 MG/ML  SOLN COMPARISON:  None Available. FINDINGS: Maxillofacial CT: Osseous: No fracture or mandibular dislocation. No destructive process. Orbits: Negative. No traumatic or inflammatory finding. Sinuses: Clear. Soft tissues: Edematous and asymmetrically enlarged right parotid gland with surrounding edema. No visible discrete, drainable fluid collection. Edema extends inferiorly into the right neck with mildly prominent right cervical chain lymph nodes that are likely reactive. No visible parotid duct calculus or ductal dilation. Limited intracranial: No significant or unexpected finding. RIGHT TEMPORAL BONE External auditory canal: Soft tissue opacifies the deep aspect of the external auditory canal without adjacent bony change. Middle ear cavity: Normally aerated. The scutum and ossicles are normal. The tegmen tympani is intact. Inner ear structures: The cochlea, vestibule and semicircular canals are normal. The vestibular aqueduct is not enlarged. Internal auditory and facial nerve canals:  Normal Mastoid air cells: Normally aerated. No osseous erosion. LEFT TEMPORAL BONE External auditory canal: Soft tissue opacifies the deep aspect of the external auditory canal without adjacent bony change. Middle ear  cavity: Normally aerated. The scutum and ossicles are normal. The tegmen tympani is intact. Inner ear structures: The cochlea, vestibule and semicircular canals are normal. The vestibular aqueduct is not enlarged. Internal auditory and facial nerve canals:  Normal. Mastoid air cells: Normally aerated. No osseous erosion. Vascular: Normal non-contrast appearance of the carotid canals, jugular bulbs and sigmoid plates. Limited intracranial:  No acute or significant finding. Soft tissues: See maxillofacial CT above for further characterization IMPRESSION: 1. Edematous and asymmetrically enlarged right parotid gland with surrounding edema, suggestive of parotiditis and cellulitis. No abscess. No visible parotid duct calculus or ductal dilation. 2. Both external auditory canals are obstructed by soft tissue attenuation. This could be cerumen; however, recommend correlation with direct a scope evaluation to exclude masses. Otherwise, unremarkable temporal bones. No mastoid effusions. Electronically Signed   By: Feliberto Harts M.D.   On: 11/06/2023 15:05    Microbiology: Results for orders placed or performed during the hospital encounter of 11/06/23  Blood culture (routine x 2)     Status: None (Preliminary result)   Collection Time: 11/06/23  1:11 PM   Specimen: BLOOD  Result Value Ref Range Status   Specimen Description BLOOD LEFT ANTECUBITAL  Final   Special Requests   Final    BOTTLES DRAWN AEROBIC AND ANAEROBIC Blood Culture adequate volume   Culture   Final    NO GROWTH 4 DAYS Performed at [facility], [address], [place], [place] [address]    Report Status PENDING  Incomplete  Blood culture (routine x 2)     Status: None (Preliminary result)   Collection Time: 11/06/23  1:11 PM   Specimen: BLOOD  Result Value Ref Range Status  Specimen Description BLOOD RIGHT ANTECUBITAL  Final   Special Requests   Final    BOTTLES DRAWN AEROBIC AND ANAEROBIC Blood Culture adequate volume    Culture   Final    NO GROWTH 4 DAYS Performed at [facility], [address] [place], [place] [address]    Report Status PENDING  Incomplete  MRSA Next Gen by PCR, Nasal     Status: None   Collection Time: 11/06/23  4:18 PM   Specimen: Nasal Mucosa; Nasal Swab  Result Value Ref Range Status   MRSA by PCR Next Gen NOT DETECTED NOT DETECTED Final    Comment: (NOTE) The GeneXpert MRSA Assay (FDA approved for NASAL specimens only), is one component of a comprehensive MRSA colonization surveillance program. It is not intended to diagnose MRSA infection nor to guide or monitor treatment for MRSA infections. Test performance is not FDA approved in patients less than 29 years old. Performed at [facility], [address] [place], [place] [address]   Group A Strep by PCR     Status: None   Collection Time: 11/06/23  4:18 PM   Specimen: Nasal Mucosa; Sterile Swab  Result Value Ref Range Status   Group A Strep by PCR NOT DETECTED NOT DETECTED Final    Comment: Performed at [facility], [address] [place], [place] [address]    Labs: CBC: Recent Labs  Lab 11/06/23 1216 11/07/23 0221 11/08/23 0527 11/09/23 0712  WBC 19.6* 21.6* 18.4* 20.0*  NEUTROABS  --   --  16.9*  --   HGB 16.6 13.9 14.9 14.2  HCT 49.4 40.9 42.4 41.0  MCV 91.1 91.1 87.6 88.7  PLT 319 259 279 330   Basic Metabolic Panel: Recent Labs  Lab 11/06/23 1216 11/07/23 0221 11/08/23 0527 11/09/23 0712  NA 132* 131* 136 140  K 3.8 3.7 4.1 4.0  CL 99 102 102 104  CO2 25 20* 25 27  GLUCOSE 109* 87 143* 153*  BUN 10 7 12 18   CREATININE 0.78 0.80 0.65 0.77  CALCIUM 8.3* 7.5* 8.4* 8.5*   Liver Function Tests: No results for input(s): "AST", "ALT", "ALKPHOS", "BILITOT", "PROT", "ALBUMIN" in the last 168 hours. CBG: No results for input(s): "GLUCAP" in the last 168 hours.  Discharge time spent: greater than 30 minutes.  Signed: Delfino Lovett, MD [facility] 11/10/2023

## 2023-11-11 LAB — CULTURE, BLOOD (ROUTINE X 2)
Culture: NO GROWTH
Culture: NO GROWTH
Special Requests: ADEQUATE
Special Requests: ADEQUATE

## 2024-09-01 ENCOUNTER — Encounter: Payer: Self-pay | Admitting: Emergency Medicine

## 2024-09-01 ENCOUNTER — Emergency Department: Admission: EM | Admit: 2024-09-01 | Discharge: 2024-09-01 | Disposition: A | Discharge status: Home / Self Care

## 2024-09-01 ENCOUNTER — Other Ambulatory Visit: Payer: Self-pay

## 2024-09-01 DIAGNOSIS — Z23 Encounter for immunization: Secondary | ICD-10-CM | POA: Diagnosis not present

## 2024-09-01 DIAGNOSIS — F17290 Nicotine dependence, other tobacco product, uncomplicated: Secondary | ICD-10-CM | POA: Diagnosis not present

## 2024-09-01 DIAGNOSIS — S8992XA Unspecified injury of left lower leg, initial encounter: Secondary | ICD-10-CM | POA: Diagnosis present

## 2024-09-01 DIAGNOSIS — Y93H2 Activity, gardening and landscaping: Secondary | ICD-10-CM | POA: Diagnosis not present

## 2024-09-01 DIAGNOSIS — S81812A Laceration without foreign body, left lower leg, initial encounter: Secondary | ICD-10-CM | POA: Insufficient documentation

## 2024-09-01 DIAGNOSIS — W208XXA Other cause of strike by thrown, projected or falling object, initial encounter: Secondary | ICD-10-CM | POA: Insufficient documentation

## 2024-09-01 MED ORDER — TETANUS-DIPHTH-ACELL PERTUSSIS 5-2-15.5 LF-MCG/0.5 IM SUSP
0.5000 mL | Freq: Once | INTRAMUSCULAR | Status: AC
  Administered 2024-09-01: 0.5 mL via INTRAMUSCULAR
  Filled 2024-09-01: qty 0.5

## 2024-09-01 MED ORDER — LIDOCAINE HCL (PF) 1 % IJ SOLN
5.0000 mL | Freq: Once | INTRAMUSCULAR | Status: AC
  Administered 2024-09-01: 5 mL
  Filled 2024-09-01: qty 5

## 2024-09-01 MED ORDER — OXYCODONE-ACETAMINOPHEN 5-325 MG PO TABS
1.0000 | ORAL_TABLET | ORAL | Status: DC | PRN
  Administered 2024-09-01: 1 via ORAL
  Filled 2024-09-01: qty 1

## 2024-09-01 NOTE — ED Provider Notes (Signed)
 [facility] Provider Note    Event Date/Time   First MD Initiated Contact with Patient 09/01/24 1434     (approximate)   History   Laceration    HPI  Leon Williams is a 33 y.o. male    with a past medical history of sepsis, smoker, who presents to the ED complaining of   lacerations on his left lower leg. According to the patient he was cutting a tree and a branch came onto his left leg.  Unknown last Tdap    Patient Active Problem List   Diagnosis Date Noted   Sepsis (HCC) 11/09/2023   Parotiditis 11/06/2023   Leukocytosis 11/06/2023   Sore throat 11/06/2023   Sinus tachycardia 11/06/2023   Smoker 01/21/2022     ROS: Patient currently denies any vision changes, tinnitus, difficulty speaking, facial droop, sore throat, chest pain, shortness of breath, abdominal pain, nausea/vomiting/diarrhea, dysuria, or weakness/numbness/paresthesias in any extremity   Physical Exam   Triage Vital Signs: ED Triage Vitals  Encounter Vitals Group     BP 09/01/24 1312 127/60     Girls Systolic BP Percentile --      Girls Diastolic BP Percentile --      Boys Systolic BP Percentile --      Boys Diastolic BP Percentile --      Pulse Rate 09/01/24 1312 84     Resp 09/01/24 1312 18     Temp 09/01/24 1312 97.9 F (36.6 C)     Temp Source 09/01/24 1312 Oral     SpO2 09/01/24 1312 98 %     Weight 09/01/24 1307 160 lb (72.6 kg)     Height 09/01/24 1307 5' 7 (1.702 m)     Head Circumference --      Peak Flow --      Pain Score 09/01/24 1307 8     Pain Loc --      Pain Education --      Exclude from Growth Chart --     Most recent vital signs: Vitals:   09/01/24 1312  BP: 127/60  Pulse: 84  Resp: 18  Temp: 97.9 F (36.6 C)  SpO2: 98%     Physical Exam Vitals and nursing note reviewed.  During triage vital signs were normal  General:          Awake, no distress.  CV:                  Good peripheral perfusion.  Resp:               Normal effort.  no tachypnea Abd:                 No distention.  Soft nontender Other:              Left lower leg: Presence of 2 lacerations in the anterior side of the superior third of the lower lip.  2 lacerations in the anterior side in the inferior third of the  leg.  Pulses positive sensation is intact.. ED Results / Procedures / Treatments   Labs (all labs ordered are listed, but only abnormal results are displayed) Labs Reviewed - No data to display     PROCEDURES:  Critical Care performed:   .Laceration Repair  Date/Time: 09/01/2024 4:49 PM  Performed by: Janit Kast, PA-C Authorized by: Janit Kast, PA-C   Consent:    Consent obtained:  Verbal   Consent given by:  Patient   Risks, benefits, and alternatives were discussed: yes     Risks discussed:  Infection and pain Universal protocol:    Procedure explained and questions answered to patient or proxy's satisfaction: yes     Patient identity confirmed:  Verbally with patient Anesthesia:    Anesthesia method:  Local infiltration   Local anesthetic:  Lidocaine 1% WITH epi Laceration details:    Location:  Leg   Leg location:  R lower leg   Length (cm):  5.5 (Laceration #1: 3.5, laceration #2 4.5, laceration number three 5.5, sedation #4; 4 cm)   Depth (mm):  2 Pre-procedure details:    Preparation:  Patient was prepped and draped in usual sterile fashion Exploration:    Limited defect created (wound extended): no     Hemostasis achieved with:  Direct pressure   Imaging outcome: foreign body not noted     Contaminated: no   Treatment:    Area cleansed with:  Povidone-iodine   Amount of cleaning:  Standard   Irrigation solution:  Sterile saline   Irrigation volume:  300   Irrigation method:  Tap   Visualized foreign bodies/material removed: no     Debridement:  None Skin repair:    Repair method:  Sutures   Suture size:  5-0   Suture material:  Nylon   Suture technique:  Simple interrupted   Number of  sutures:  33 Approximation:    Approximation:  Close Post-procedure details:    Dressing:  Adhesive bandage   Procedure completion:  Tolerated well, no immediate complications    MEDICATIONS ORDERED IN ED: Medications  oxyCODONE -acetaminophen  (PERCOCET/ROXICET) 5-325 MG per tablet 1 tablet (1 tablet Oral Given 09/01/24 1310)  Tdap (ADACEL) injection 0.5 mL (0.5 mLs Intramuscular Given 09/01/24 1612)  lidocaine (PF) (XYLOCAINE) 1 % injection 5 mL (5 mLs Other Given 09/01/24 1613)  lidocaine (PF) (XYLOCAINE) 1 % injection 5 mL (5 mLs Other Given 09/01/24 1613)      IMPRESSION / MDM / ASSESSMENT AND PLAN / ED COURSE  I reviewed the triage vital signs and the nursing notes.  Differential diagnosis includes, but is not limited to, laceration, avulsion, foreign body, fracture  Patient's presentation is most consistent with acute, uncomplicated illness.   Leon Williams is a 33 y.o., male who presents today with history of laceration nose on his left leg with tree branch.  On a physical exam evidence of 4 lacerations in the anterior side of the left lower extremity. Patient's diagnosis is consistent with multiple lacerations left lower leg. I did not order any imaging or labs, physical exam was reassuring. I did review the patient's allergies and medications.The patient is in stable and satisfactory condition for discharge home  Patient will be discharged home without prescriptions.  Did advise the patient to take ibuprofen 600 mg every 8 hours as needed for pain.  I did advise the patient to elevate the left lower leg.Patient is to follow up with urgent care for suture removal in 7 days as needed or otherwise directed. Patient is given ED precautions to return to the ED for any worsening or new symptoms. Discussed plan of care with patient, answered all of patient's questions, Patient agreeable to plan of care. Advised patient to take medications according to the instructions on the label.  Discussed possible side effects of new medications. Patient verbalized understanding.  FINAL CLINICAL IMPRESSION(S) / ED DIAGNOSES   Final diagnoses:  Laceration of left lower extremity, initial encounter  Rx / DC Orders   ED Discharge Orders     None        Note:  This document was prepared using Dragon voice recognition software and may include unintentional dictation errors.   Janit Kast, PA-C 09/01/24 1653    Clarine Ozell LABOR, MD 09/02/24 [phone]

## 2024-09-01 NOTE — ED Notes (Signed)
 See triage note  Presents with laceration to left lower leg  States he was cutting a tree A limb struck his lower leg  Pt has 4 small lacerations to lower leg  Bleeding controlled

## 2024-09-01 NOTE — Discharge Instructions (Addendum)
 You have been diagnosed with multiple lacerations in your left lower extremity.  Please do not submerge your left leg in water to prevent infection.  You can take ibuprofen 600 mg every 8 hours as needed for pain.  Please go to urgent care in 7 days to remove sutures.  Please check for signs of infection.  Please come back to ED if you have new symptoms or symptoms worsen.  It was a pleasure to help you today.  Lorane Sar, PA.

## 2024-09-01 NOTE — ED Triage Notes (Signed)
 Pt was cutting tree and it fell on his left leg. Multiple lacerations to left lower leg.

## 2024-09-09 ENCOUNTER — Ambulatory Visit
Admission: EM | Admit: 2024-09-09 | Discharge: 2024-09-09 | Disposition: A | Discharge status: Home / Self Care | Attending: Emergency Medicine | Admitting: Emergency Medicine

## 2024-09-09 DIAGNOSIS — Z5189 Encounter for other specified aftercare: Secondary | ICD-10-CM

## 2024-09-09 MED ORDER — CEPHALEXIN 500 MG PO CAPS: 500.0000 mg | ORAL_CAPSULE | Freq: Three times a day (TID) | ORAL | 0 refills | Status: AC

## 2024-09-09 NOTE — ED Provider Notes (Signed)
 [facility]    CSN: [id number] Arrival date & time: 09/09/24  0935      History   Chief Complaint Chief Complaint  Patient presents with   Laceration    HPI Leon Williams is a 33 y.o. male.   HPI  32 year old male with past medical history significant for sepsis, sinus tachycardia, leukocytosis, and who is a smoker presents with request for suture removal from 4 left lower extremity lacerations.  He was seen in the emergency department at [facility] where he had 30 3 sutures placed in 4 lacerations.  He was discharged home with instructions to have the sutures removed in 7 days.  He denies fever, pain, or drainage.  Past Medical History:  Diagnosis Date   Tobacco use     Patient Active Problem List   Diagnosis Date Noted   Sepsis (HCC) 11/09/2023   Parotiditis 11/06/2023   Leukocytosis 11/06/2023   Sore throat 11/06/2023   Sinus tachycardia 11/06/2023   Smoker 01/21/2022    History reviewed. No pertinent surgical history.     Home Medications    Prior to Admission medications   Medication Sig Start Date End Date Taking? Authorizing Provider  cephALEXin (KEFLEX) 500 MG capsule Take 1 capsule (500 mg total) by mouth 3 (three) times daily for 7 days. 09/09/24 09/16/24 Yes Bernardino Ditch, NP    Family History Family History  Problem Relation Age of Onset   Hypertension Father     Social History Social History   Tobacco Use   Smoking status: Every Day    Types: Cigarettes   Smokeless tobacco: Never  Vaping Use   Vaping status: Every Day  Substance Use Topics   Alcohol use: Never   Drug use: Not Currently    Types: Marijuana     Allergies   Patient has no known allergies.   Review of Systems Review of Systems  Constitutional:  Negative for fever.  Skin:  Positive for color change. Negative for wound.     Physical Exam Triage Vital Signs ED Triage Vitals  Encounter Vitals Group     BP 09/09/24 1034 132/71     Girls Systolic BP  Percentile --      Girls Diastolic BP Percentile --      Boys Systolic BP Percentile --      Boys Diastolic BP Percentile --      Pulse Rate 09/09/24 1034 61     Resp --      Temp 09/09/24 1034 98.2 F (36.8 C)     Temp Source 09/09/24 1034 Oral     SpO2 09/09/24 1034 100 %     Weight 09/09/24 1033 149 lb 2.2 oz (67.6 kg)     Height --      Head Circumference --      Peak Flow --      Pain Score 09/09/24 1032 0     Pain Loc --      Pain Education --      Exclude from Growth Chart --    No data found.  Updated Vital Signs BP 132/71 (BP Location: Left Arm)   Pulse 61   Temp 98.2 F (36.8 C) (Oral)   Wt 149 lb 2.2 oz (67.6 kg)   SpO2 100%   BMI 23.36 kg/m   Visual Acuity Right Eye Distance:   Left Eye Distance:   Bilateral Distance:    Right Eye Near:   Left Eye Near:    Bilateral Near:  Physical Exam Vitals and nursing note reviewed.  Constitutional:      Appearance: Normal appearance. He is not ill-appearing.  HENT:     Head: Normocephalic and atraumatic.  Musculoskeletal:        General: Signs of injury present. No swelling or tenderness.  Skin:    General: Skin is warm and dry.     Capillary Refill: Capillary refill takes less than 2 seconds.     Findings: Erythema present.  Neurological:     General: No focal deficit present.     Mental Status: He is alert and oriented to person, place, and time.      UC Treatments / Results  Labs (all labs ordered are listed, but only abnormal results are displayed) Labs Reviewed - No data to display  EKG   Radiology No results found.  Procedures Procedures (including critical care time)  Medications Ordered in UC Medications - No data to display  Initial Impression / Assessment and Plan / UC Course  I have reviewed the triage vital signs and the nursing notes.  Pertinent labs & imaging results that were available during my care of the patient were reviewed by me and considered in my medical  decision making (see chart for details).   Patient is a pleasant, nontoxic-appearing 33 year old male presenting with request for suture removal from 4 lacerations on the anterior aspect of his left lower extremity.  He had sutures placed 8 days ago at [facility].    While the patient denies any drainage from the wounds there is serosanguineous pus draining from the long middle laceration and the edges have not fully closed.  There is surrounding erythema of all of the lacerations which is concerning for developing cellulitis.  Also, I feel it is too early to remove the sutures given the amount of tension that these lacerations are under on the anterior aspect of the lower leg and the fact that the 1 laceration does not appear to have completely closed.  I fear that if I remove the sutures at this time the wounds will dehisce leaving the patient more prone to infection and with the wounds need to heal by primary intention which will prolong healing time.  I will start him on Keflex 500 mg 3 times daily to cover for potential cellulitis and have him return on day 12-14 for reevaluation and suture removal at that time.   Final Clinical Impressions(s) / UC Diagnoses   Final diagnoses:  Visit for wound check     Discharge Instructions      As we discussed, I feel it is too early to remove the sutures as some of your wounds appear to of not fully healed.  If we remove the sutures now they could possibly dehisce (open up) which would leave you more prone to infection and prolong your healing time.  Take the Keflex 500 mg every 8 hours, with food, to treat the developing cellulitis from your lacerations.  Keep your left lower extremity elevated is much as possible to decrease swelling and aid in pain relief.  Please return on day 12-14 for reevaluation and possible suture removal at that time.  If you develop any increased redness, swelling, pus drainage from the wounds, red streaks going up your leg,  or fever you need to go back to the ER for evaluation.     ED Prescriptions     Medication Sig Dispense Auth. Provider   cephALEXin (KEFLEX) 500 MG capsule Take 1 capsule (500  mg total) by mouth 3 (three) times daily for 7 days. 21 capsule Bernardino Ditch, NP      PDMP not reviewed this encounter.   Bernardino Ditch, NP 09/09/24 1058

## 2024-09-09 NOTE — ED Triage Notes (Signed)
 Pt is with his father  Pt states that he was doing yardwork and received an injury while cutting trees  Pt has 4 lacerations along the lower left leg and received 33 sutures at the hospital on 10.09.25  Pt states that they did not give any antibiotics or ointment for him to put along his injuries.   Pt denies pain, drainage, or bleeding

## 2024-09-09 NOTE — Discharge Instructions (Signed)
 As we discussed, I feel it is too early to remove the sutures as some of your wounds appear to of not fully healed.  If we remove the sutures now they could possibly dehisce (open up) which would leave you more prone to infection and prolong your healing time.  Take the Keflex 500 mg every 8 hours, with food, to treat the developing cellulitis from your lacerations.  Keep your left lower extremity elevated is much as possible to decrease swelling and aid in pain relief.  Please return on day 12-14 for reevaluation and possible suture removal at that time.  If you develop any increased redness, swelling, pus drainage from the wounds, red streaks going up your leg, or fever you need to go back to the ER for evaluation.

## 2024-09-16 ENCOUNTER — Ambulatory Visit: Admission: EM | Admit: 2024-09-16 | Discharge: 2024-09-16 | Disposition: A | Discharge status: Home / Self Care

## 2024-09-16 NOTE — ED Triage Notes (Addendum)
 Pt is with his father  Pt presents for a suture removal to left lower leg  Pt reports no swelling, redness, tightness, or discomfort  Pt states that all drainage has stopped and that he has completed his antibiotics  Pt had 33 sutures removed from the left lower leg  Pt voiced no concerns or discomfort during suture removal.
